# Patient Record
Sex: Female | Born: 1960
Health system: Southern US, Community
[De-identification: ages and names within clinical notes are randomized; demographics above are authoritative.]

## PROBLEM LIST (undated history)

## (undated) DIAGNOSIS — E079 Disorder of thyroid, unspecified: Secondary | ICD-10-CM

## (undated) DIAGNOSIS — E119 Type 2 diabetes mellitus without complications: Secondary | ICD-10-CM

## (undated) DIAGNOSIS — T7840XA Allergy, unspecified, initial encounter: Secondary | ICD-10-CM

## (undated) DIAGNOSIS — I10 Essential (primary) hypertension: Secondary | ICD-10-CM

## (undated) DIAGNOSIS — Z9989 Dependence on other enabling machines and devices: Secondary | ICD-10-CM

## (undated) DIAGNOSIS — G4730 Sleep apnea, unspecified: Secondary | ICD-10-CM

## (undated) DIAGNOSIS — M1990 Unspecified osteoarthritis, unspecified site: Secondary | ICD-10-CM

## (undated) DIAGNOSIS — E7800 Pure hypercholesterolemia, unspecified: Secondary | ICD-10-CM

## (undated) DIAGNOSIS — J45909 Unspecified asthma, uncomplicated: Secondary | ICD-10-CM

## (undated) DIAGNOSIS — K219 Gastro-esophageal reflux disease without esophagitis: Secondary | ICD-10-CM

## (undated) DIAGNOSIS — R87619 Unspecified abnormal cytological findings in specimens from cervix uteri: Secondary | ICD-10-CM

## (undated) DIAGNOSIS — G4733 Obstructive sleep apnea (adult) (pediatric): Secondary | ICD-10-CM

## (undated) HISTORY — DX: Allergy, unspecified, initial encounter: T78.40XA

## (undated) HISTORY — DX: Dependence on other enabling machines and devices: Z99.89

## (undated) HISTORY — DX: Unspecified asthma, uncomplicated: J45.909

## (undated) HISTORY — DX: Unspecified osteoarthritis, unspecified site: M19.90

## (undated) HISTORY — PX: ABDOMINAL HYSTERECTOMY: SHX81

## (undated) HISTORY — DX: Unspecified abnormal cytological findings in specimens from cervix uteri: R87.619

## (undated) HISTORY — DX: Pure hypercholesterolemia, unspecified: E78.00

## (undated) HISTORY — PX: GYNECOLOGIC CRYOSURGERY: SHX857

## (undated) HISTORY — DX: Essential (primary) hypertension: I10

## (undated) HISTORY — DX: Disorder of thyroid, unspecified: E07.9

## (undated) HISTORY — DX: Obstructive sleep apnea (adult) (pediatric): G47.33

## (undated) HISTORY — DX: Sleep apnea, unspecified: G47.30

## (undated) HISTORY — DX: Gastro-esophageal reflux disease without esophagitis: K21.9

## (undated) HISTORY — PX: SPINE SURGERY: SHX786

## (undated) HISTORY — PX: TOTAL VAGINAL HYSTERECTOMY: SHX2548

## (undated) HISTORY — DX: Type 2 diabetes mellitus without complications: E11.9

---

## 1992-01-26 HISTORY — PX: LAPAROSCOPIC CHOLECYSTECTOMY: SUR755

## 1998-01-25 HISTORY — PX: CARPAL TUNNEL RELEASE: SHX101

## 1999-09-04 ENCOUNTER — Other Ambulatory Visit: Admission: RE | Admit: 1999-09-04 | Discharge: 1999-09-04 | Payer: Self-pay | Admitting: Gynecology

## 2000-01-20 ENCOUNTER — Ambulatory Visit (HOSPITAL_BASED_OUTPATIENT_CLINIC_OR_DEPARTMENT_OTHER): Admission: RE | Admit: 2000-01-20 | Discharge: 2000-01-20 | Payer: Self-pay | Admitting: Orthopedic Surgery

## 2000-11-23 ENCOUNTER — Other Ambulatory Visit: Admission: RE | Admit: 2000-11-23 | Discharge: 2000-11-23 | Payer: Self-pay | Admitting: Gynecology

## 2000-12-02 ENCOUNTER — Ambulatory Visit (HOSPITAL_COMMUNITY): Admission: RE | Admit: 2000-12-02 | Discharge: 2000-12-02 | Payer: Self-pay | Admitting: Gynecology

## 2000-12-02 ENCOUNTER — Encounter: Payer: Self-pay | Admitting: Gynecology

## 2000-12-19 ENCOUNTER — Encounter: Admission: RE | Admit: 2000-12-19 | Discharge: 2000-12-19 | Payer: Self-pay | Admitting: Gynecology

## 2000-12-19 ENCOUNTER — Encounter: Payer: Self-pay | Admitting: Gynecology

## 2001-11-20 ENCOUNTER — Ambulatory Visit (HOSPITAL_BASED_OUTPATIENT_CLINIC_OR_DEPARTMENT_OTHER): Admission: RE | Admit: 2001-11-20 | Discharge: 2001-11-20 | Payer: Self-pay | Admitting: *Deleted

## 2002-01-01 ENCOUNTER — Other Ambulatory Visit: Admission: RE | Admit: 2002-01-01 | Discharge: 2002-01-01 | Payer: Self-pay | Admitting: Gynecology

## 2002-01-25 HISTORY — PX: NASAL SINUS SURGERY: SHX719

## 2002-02-09 ENCOUNTER — Ambulatory Visit (HOSPITAL_COMMUNITY): Admission: RE | Admit: 2002-02-09 | Discharge: 2002-02-10 | Payer: Self-pay | Admitting: Otolaryngology

## 2002-02-09 ENCOUNTER — Encounter (INDEPENDENT_AMBULATORY_CARE_PROVIDER_SITE_OTHER): Payer: Self-pay | Admitting: Specialist

## 2002-06-19 ENCOUNTER — Ambulatory Visit (HOSPITAL_BASED_OUTPATIENT_CLINIC_OR_DEPARTMENT_OTHER): Admission: RE | Admit: 2002-06-19 | Discharge: 2002-06-19 | Payer: Self-pay | Admitting: Otolaryngology

## 2003-01-23 ENCOUNTER — Other Ambulatory Visit: Admission: RE | Admit: 2003-01-23 | Discharge: 2003-01-23 | Payer: Self-pay | Admitting: Gynecology

## 2003-04-26 ENCOUNTER — Encounter: Admission: RE | Admit: 2003-04-26 | Discharge: 2003-04-26 | Payer: Self-pay | Admitting: Gynecology

## 2004-01-26 HISTORY — PX: MENISCUS REPAIR: SHX5179

## 2004-01-27 ENCOUNTER — Other Ambulatory Visit: Admission: RE | Admit: 2004-01-27 | Discharge: 2004-01-27 | Payer: Self-pay | Admitting: Gynecology

## 2004-05-12 ENCOUNTER — Encounter: Admission: RE | Admit: 2004-05-12 | Discharge: 2004-05-12 | Payer: Self-pay | Admitting: Gynecology

## 2005-05-13 ENCOUNTER — Other Ambulatory Visit: Admission: RE | Admit: 2005-05-13 | Discharge: 2005-05-13 | Payer: Self-pay | Admitting: Obstetrics and Gynecology

## 2005-05-13 ENCOUNTER — Encounter: Admission: RE | Admit: 2005-05-13 | Discharge: 2005-05-13 | Payer: Self-pay | Admitting: Geriatric Medicine

## 2006-06-01 ENCOUNTER — Encounter: Admission: RE | Admit: 2006-06-01 | Discharge: 2006-06-01 | Payer: Self-pay | Admitting: Geriatric Medicine

## 2006-08-03 ENCOUNTER — Other Ambulatory Visit: Admission: RE | Admit: 2006-08-03 | Discharge: 2006-08-03 | Payer: Self-pay | Admitting: Obstetrics & Gynecology

## 2006-12-12 ENCOUNTER — Encounter: Payer: Self-pay | Admitting: Obstetrics & Gynecology

## 2006-12-12 ENCOUNTER — Inpatient Hospital Stay (HOSPITAL_COMMUNITY): Admission: RE | Admit: 2006-12-12 | Discharge: 2006-12-14 | Payer: Self-pay | Admitting: Obstetrics & Gynecology

## 2007-07-31 ENCOUNTER — Encounter: Admission: RE | Admit: 2007-07-31 | Discharge: 2007-07-31 | Payer: Self-pay | Admitting: Geriatric Medicine

## 2007-08-21 ENCOUNTER — Encounter: Admission: RE | Admit: 2007-08-21 | Discharge: 2007-08-21 | Payer: Self-pay | Admitting: Obstetrics & Gynecology

## 2007-10-26 HISTORY — PX: CERVICAL FUSION: SHX112

## 2007-11-14 ENCOUNTER — Ambulatory Visit (HOSPITAL_COMMUNITY): Admission: RE | Admit: 2007-11-14 | Discharge: 2007-11-15 | Payer: Self-pay | Admitting: Neurological Surgery

## 2008-07-22 ENCOUNTER — Ambulatory Visit (HOSPITAL_BASED_OUTPATIENT_CLINIC_OR_DEPARTMENT_OTHER): Admission: RE | Admit: 2008-07-22 | Discharge: 2008-07-22 | Payer: Self-pay | Admitting: Otolaryngology

## 2008-07-29 ENCOUNTER — Ambulatory Visit: Payer: Self-pay | Admitting: Pulmonary Disease

## 2008-09-04 ENCOUNTER — Encounter: Admission: RE | Admit: 2008-09-04 | Discharge: 2008-09-04 | Payer: Self-pay | Admitting: Obstetrics & Gynecology

## 2008-09-25 DIAGNOSIS — Z9989 Dependence on other enabling machines and devices: Secondary | ICD-10-CM

## 2008-09-25 DIAGNOSIS — G4733 Obstructive sleep apnea (adult) (pediatric): Secondary | ICD-10-CM

## 2008-09-25 HISTORY — DX: Obstructive sleep apnea (adult) (pediatric): G47.33

## 2008-09-25 HISTORY — DX: Obstructive sleep apnea (adult) (pediatric): Z99.89

## 2009-09-22 ENCOUNTER — Encounter: Admission: RE | Admit: 2009-09-22 | Discharge: 2009-09-22 | Payer: Self-pay | Admitting: Obstetrics & Gynecology

## 2010-06-09 NOTE — Discharge Summary (Signed)
[name], [name]              ACCOUNT NO.:  [id number]   MEDICAL RECORD NO.:  [id number]          PATIENT TYPE:  INP   LOCATION:  9313                          FACILITY:  [facility]   PHYSICIAN:  M. Leda Quail, MD  DATE OF BIRTH:  14-Aug-1960   DATE OF ADMISSION:  12/12/2006  DATE OF DISCHARGE:  12/14/2006                               DISCHARGE SUMMARY   ADMISSION DIAGNOSES:  97. A 50 year old, G0, married white female with large fibroid uterus.  2. Hypertension.  3. Hyperlipidemia.  4. Allergies.  5. Obesity.   PROCEDURE:  Total abdominal hysterectomy through a midline incision.   HISTORY OF PRESENT ILLNESS:  A H&P is written in the chart, but in  brief, [name] is a 50 year old, G0, married, white female who has  a large fibroid uterus.  She saw me initially this year where on  abdominal and pelvic exam I immediately felt this large uterus.  She had  never been told that she had an enlarged uterus in the past.  This  uterus had multiple fibroids and was approximately 17-18 cm in size.  Once she was aware of it, she felt like multiple symptoms were due to  this including low back pain and pelvic pain.  Though she did not have  heavy bleeding, she wished for a surgical excision.  She and I discussed  the risks and benefits of this, particularly in light of her having not  typical symptoms with her enlarged large uterus.  She was adamant about  having this removed, and due to its size, I did feel that it was  appropriate also.  We do not know how long it has been this size because  of previous gynecologist never noted its presence, and she has not had  any pelvic imaging in several years.   HOSPITAL COURSE:  The patient was admitted through same-day surgery.  She was taken to the operating room where a total abdominal hysterectomy  was performed.  Uterus and cervix were sent to pathology.  Blood loss of  250 mL occurred during the procedure.  She does have morbid obesity,  and  the surgery was technically difficult.  However, the procedure did go  well.  A JP drain was placed subcutaneously after the surgery to help  with seroma formation.  The patient did well during her surgery and was  taken to the recovery room.  After an appropriate time there, she was  transferred to the third floor for the remainder of her hospitalization.  She did have a Foley catheter placed to straight drain and did have a  Dilaudid PCA for pain control.  She also had an OnQ pump that was  subfascially.  The patient has an aspirin allergy, and I did not feel  that it was appropriate to use Toradol on her.  In the evening of  postoperative day 0, she was having some heartburn and some mild nausea  but otherwise was doing well.  Vital signs were stable.  She was  afebrile.  She was making good urine.  Her abdomen was quiet, but her  exam was otherwise benign.  Pepcid was added IV.  In the morning of  postoperative day #1, the patient had no complaints.  She looked very  good and had excellent pain control.  Vital signs were stable.  She was  afebrile.  She had had 4150 mL of urine output overnight.  Postoperative  labs showed a white count 5.7, hemoglobin of 11.1, platelet count of  189.  Electrolytes were normal with a BUN and creatinine of 4 and 0.8,  and glucose was 130.  She did had some occasional bowel sounds.  Her  abdomen was soft, and she had a clean, dry, and intact incision.  She  had about 20 mL of serosanguineous drainage from her JP drain.  Again,  she was on the evening of postoperative day #1 without any issues.  In  the morning of postoperative day #2, she was without complaints and had  excellent pain control.  Vital signs again were stable.  She was voiding  without difficulty.  Her JP had put out 30 mL all day the previous day  and 5 mL since midnight.  At this point, she was tolerating a regular  diet, ambulating without difficulty, voiding without difficulty,  and  ambulating without difficulty.  She had a completely benign exam.  At  this point, I did feel discharge was appropriate.   DISCHARGE MEDICATIONS:  She is to resume all of her home medications  which include:  1. Proventil inhaler p.r.n.  2. Zetia 10 mg daily.  3. Singulair 10 mg daily.  4. Xyzal 5 mg daily.  5. Lotrel 5/20 mg daily.  6. Crestor 40 mg daily.  7. Hydrochlorothiazide 12.5 mg daily.  8. She is also going to be using Percocet 5/325 one to two tablets      p.o. q.4-6h. p.r.n. pain.  9. She is taking Cipro 500 mg b.i.d. for 7 days and Keflex 500 mg      q.i.d. for 7 days.   FOLLOW UP:  She has been given discharge instructions in the written and  verbal form, and she will follow up with me in 1 week.  The patient also  has numbers to call if she has any questions or problems.      Lum Keas, MD  Electronically Signed     MSM/MEDQ  D:  12/14/2006  T:  12/14/2006  Job:  [id number]

## 2010-06-09 NOTE — Op Note (Signed)
[name], [name]              ACCOUNT NO.:  [id number]   MEDICAL RECORD NO.:  [id number]          PATIENT TYPE:  OIB   LOCATION:  3524                         FACILITY:  [facility]   PHYSICIAN:  Stefani Dama, M.D.  DATE OF BIRTH:  October 01, 1960   DATE OF PROCEDURE:  11/14/2007  DATE OF DISCHARGE:                               OPERATIVE REPORT   PREOPERATIVE DIAGNOSES:  Cervical spondylosis and herniated nucleus  pulposus, C4-5 and C5-6 with a cervical radiculopathy.   POSTOPERATIVE DIAGNOSES:  Cervical spondylosis and herniated nucleus  pulposus, C4-5 and C5-6 with a cervical radiculopathy.   PROCEDURES:  Anterior cervical decompression arthrodesis at C4-5 and C5-  6, structural allograft, and Alphatec plate fixation, C4-C6.   SURGEON:  Stefani Dama, MD   FIRST ASSISTANT:  Coletta Memos, MD   ANESTHESIA:  General endotracheal.   INDICATIONS:  [name] is a 50 year old female, who has had significant  neck, shoulder, and arm pain.  She has evidence of spondylosis at C4-C5  and C5-C6 with a herniated nucleus pulposus on the right side of C4-C5  and spondylotic narrowing at C5-6.  She has been advised regarding  surgical decompression and is now taken to the operating room.   PROCEDURE:  The patient was brought to the operating room supine on the  stretcher.  After smooth induction of general endotracheal anesthesia,  she was placed in 5 pounds of Halter traction.  Neck was prepped with  alcohol and DuraPrep, and draped in a sterile fashion.  A transverse  incision was carried down through the platysma.  The plane between the  sternocleidomastoid and strap muscles was dissected bluntly until the  prevertebral space was reached.  First identifiable disk space was noted  to be that of C4-C5 and localizing  radiographs and then longus colli  muscle was stripped from either side in the midline to allow placement  of Caspar retractor.  With the disk space being exposed, a 15 blade  was  used to open the anterior  longitudinal ligament and a combination of  curettes and rongeurs were used to remove the disk.  In the region of  the posterior longitudinal ligament on the right side, there was noted  to be a significant opening with several fragments of disk material that  had herniated self through and lateral recess.  This was removed.  Left  side was similarly decompressed.  There was no such opening on the left  side.  Endplates were then smoothed with a bur, that was 4.5 mm in  diameter and was barrel shaped.  Once the endplates were completely  smoothed, the bone graft that was in the Alphatec graft, measuring 7-mm  in height was contoured the appropriate dimensions and fit in the  interspace and then filled with demineralized bone matrix placed into  the recess and countersunk appropriately.  Attention was then turned to  C5-6, where a similar procedure was carried out.  Here that was noted to  be bilateral foraminal stenosis and significant  spondylotic overgrowth.  This was decompressed in the similar fashion.  Interspace and 8-mm bone  graft was finished in the same fashion as it was in C4-5 contoured  appropriately, filled with demineralized bone matrix and placed into the  interspace.  Traction was then removed.  Ventral aspect was inspected  and a 34-mm plate was fixed into the ventral aspect of the vertebral  bodies with 4 variable angled locking 14-mm screws.  Final radiograph  identified good position of the hardware and  good overall maintenance of the alignment.  At this point, the platysma  was then closed with 3-0 Vicryl in an interrupted fashion, 3-0 Vicryl  was used in the subcuticular tissues, and dry sterile dressing was  applied to the skin.  The patient tolerated procedure well, was returned  to recovery room in stable condition.      Stefani Dama, M.D.  Electronically Signed     HJE/MEDQ  D:  11/14/2007  T:  11/15/2007  Job:   [id number]

## 2010-06-09 NOTE — Op Note (Signed)
NAMETIERA, MENSINGER              ACCOUNT NO.:  [id number]   MEDICAL RECORD NO.:  [id number]          PATIENT TYPE:  AMB   LOCATION:  SDC                           FACILITY:  [facility]   PHYSICIAN:  M. Leda Quail, MD  DATE OF BIRTH:  Mar 15, 1960   DATE OF PROCEDURE:  12/12/2006  DATE OF DISCHARGE:                               OPERATIVE REPORT   PREOPERATIVE DIAGNOSES:  17. A 50 year old G0 married white female with fibroid uterus.  2. Hypertension.  3. Elevated lipids.  4. Seasonal allergies.  5. Morbid obesity.   POSTOPERATIVE DIAGNOSES:  64. A 50 year old G0 married white female with fibroid uterus.  2. Hypertension.  3. Elevated lipids.  4. Seasonal allergies.  5. Morbid obesity.   PROCEDURE:  Total abdominal hysterectomy.   SURGEON:  M. Leda Quail, M.D.   ASSISTANT:  Edwena Felty. Romine, M.D.   ANESTHESIA:  General endotracheal.   FINDINGS:  Enlarged globular uterus 16 to 18 weeks in size with multiple  fibroids.  At least a 7- to 8-cm fundal fibroid was present and an  approximately 5-cm fibroid that was just above the level of the internal  os.   SPECIMENS:  Uterus and cervix sent to pathology.   ESTIMATED BLOOD LOSS:  250 mL.   URINE OUTPUT:  100 mL of clear urine in the Foley catheter.   FLUIDS:  2500 mL of LR.   COMPLICATIONS:  None.   INDICATIONS:  Ms. Tuel is a 50 year old G0 married white female with  fibroid uterus that was noted on physical exam.  She was a new patient  to me this year.  I recommended doing a pelvic ultrasound.  She had a 17-  cm plus uterus that was noted.  There were multiple fibroids present  with the largest one at least 7 cm in size.  She had seen a gynecologist  regularly and never been told of this problem.  Although she does not  have menorrhagia, she does have some issues with pelvic and back pain.  As soon as she learned that she had a uterus this big she wanted it out.  It was counseled this was most likely benign  and that she was not having  some of the common symptoms that women have with enlarged uteruses,  particularly bleeding.  However, she requested surgical removal, and  given its size and difficulty in knowing how long it had been present or  how fast it was growing, I did agree with the patient.  She does have  morbid obesity, and she has been counseled about the risks of surgery,  specifically infection risk and skin incision infection risk.  All of  this was documented in her office chart.   PROCEDURE:  Patient was taken to the operating room.  She was placed in  a supine position.  Running IV in place.  General endotracheal  anesthesia is administered by the anesthesia staff without difficulty.  Legs were positioned in the frog-leg position.  Abdomen, perineum, inner  thighs and vagina were prepped in normal sterile fashion.  Foley  catheter was inserted under  sterile conditions.  Legs were straightened.  There are in a little bit of a bend with a pillow underneath the knees.  The patient was then draped in normal sterile fashion.  Midline skin  incision was made with a knife and carried down through subcutaneous fat  and tissue.  Cautery was used as necessary.  The fascia was identified  and incised in the midline.  The midline appeared to be slightly right  of the incision.  Kocher's clamp were used to grasp the incision and  elevate it.  Division of the rectus muscles was noted at this point.  The fascial incision was extended superiorly and inferiorly.  Then, the  preperitoneal fat tissue was dissected bluntly.  Peritoneum was  identified, popped through with a hemostat.  An operator's fingers were  placed in this incision.  The operator's fingers were used to feel  around; no adhesions were noted.  Using cautery, the peritoneal incision  was extended superiorly and inferiorly with care taken to note location  of the bladder and the bowel.  Once the incision was closer to the   bladder inferiorly, Metzenbaums were used to dissect the tissue  carefully, and the incision was brought down on the right side of the  bladder, keeping adequate distance from the bladder.  At this point, the  pelvis was surveyed.  Over on the right hand side had a small cyst.  The  left side showed a small ovary.  The uterus did have fibroids as noted  on ultrasound.  There was a large fundal with several smaller fundal  fibroids.  Tissue was smooth.  However, there was posterior fibroid as  well, and the uterus did not pull up very easily.  Decision was made to  go ahead and place the retractor.  A Balfour retractor was placed with  the deep blades on both sides.  Due to her obesity, there was not the  great visualization at this point.  We were as far inferiorly as we  could go, and decision was made to make the incision superiorly about 2  cm higher.  This was still below the level of the umbilicus.  The  peritoneal incision, and fascial incision was extended superiorly as  well.  This still created some difficulty, so the Terressa Koyanagi was removed,  and an Teacher, early years/pre was attempted.  Once this was tightened down,  there was no improvement in visualization, and therefore, the Balfour  retractor was obtained.  This was placed again.  Care was taken to  ensure no bowel was present as the retractor was tightened.  Then, 4  laparotomy sponges were used to pack bowel superiorly.  Bladder blade  was placed inferiorly.  Unfortunately, the patient did have quite a bit  of pelvic fat as well and an additional curved Deaver or a sweetheart  retractor was required throughout the entire procedure as well as  manipulating and retracting on the uterus for the operator and assistant  to see adequately.  This initial portion of the procedure to look at  least 30-45 minutes to get adequate visualization, and as stated above,  it was still necessary to have an additional assistant holding a  retractor  throughout most of the procedure.   Attention was initially turned to the left round ligament.  This was  suture ligated and incised with cautery.  The anterior leaf of broad  ligament was opened.  This was difficult due to the anterior fibroid  that  was present.  Care was taken to ensure that the dissection was kept  high and close to the uterus.  The posterior leaf of the broad ligament  was then opened.  The utero-ovarian pedicle on the left side was  isolated, doubly clamped with Heaney clamps, transected and suture  ligated with a free tie of #0 running Vicryl in a stitch tie.  In a  similar fashion, the right uterosacral ligament was then suture ligated.  Retractors needed to be manipulated as well as the uterus to see  adequately.  This #0 Vicryl stitch was placed on the round ligament.  Then, the round ligament was incised.  Inferior leaf of the broad  ligament was then opened with Metzenbaum scissors.  This was carried to  the midline and met where the previous dissection had occurred.  At this  point, the peritoneum was still up on the fibroid that was coming off  the superior aspect of the cervix.  The uterus was lifted out of the  pelvis and pushed posteriorly as well to visualize the space better.  Then, the peritoneum was dissected off of the fibroid.  At the level of  the internal os, the pubovesical cervical fascia dissection could then  be performed.  At this point, attention was turned back to the right  side.  The right utero-ovarian ligament was isolated and then clamped  with a curved Heaney clamp x2.  This was transected and then tied with a  free tie of #0 Vicryl and a stitch tie.  At this point, the uterine  arteries were skeletonized bilaterally, clamped twice with Heaney  clamps, transected and suture ligated with free tie of #0 Vicryl and  then a stitch tie.  At this point, the uterus could be amputated.  Unfortunately, the uterus could not be lifted well enough  to amputate  the whole uterus, so the large fundal fibroid first was taken off.  Once  this was done, 2 clamps on either side of the cervix of the beginning of  the cardinal ligament were placed.  These were transected and suture  ligated with #0 Vicryl.  At this point, the uterus could then be  amputated.  Once this was done, 2 Kocher clamps were placed across the  cervix.  The pelvis was easily visualized at this point.  The  pubovesical cervical fascia was then further dissected.  The bladder was  pushed down the cervix.  Cardinal ligaments were serially clamped,  transected and suture ligated with further dissection of the bladder  flap as necessary.  The uterosacral ligament on each side were then  clamped, transected and suture ligated with a #0 Vicryl.  These stitches  were kept long.  At this point, curved Heaney clamps could be placed  around the base of the cervix.  This was done without difficulty.  The  pedicles were transected and suture ligated with Heaney sutures of #0  Vicryl.  Then, using Jorgenson scissors and keeping close to the cervix,  vaginal mucosa was excised from the edge of the cervix.  Vaginal mucosa  was grasped with curved Kocher clamps on each side.  Then, the corners  were stitched using #0 Vicryl and attaching the edge of the vaginal cuff  to the ipsilateral uterosacral ligament.  Then, 3 additional figure-of-  eight sutures of #0 Vicryl used to close the remainder of the cuff.  The  bladder was well below the level of the cuff closure.   At  this point, pelvis was irrigated with copious amounts of warm normal  saline.  There was a small amount of bleeding at the cuff, and an  additional figure-of-eight suture of #0 Vicryl was required to make  hemostasis present.  The bladder blade was admitted at this point, and  there was some bleeding off of the back of the bladder.  DeBakey forceps  were used to grasp each area of bleeding which was then cauterized.   This was done very superficial and lightly only to ensure hemostasis.  Gelfoam was placed on the back of the bladder.  The ovaries were  visualized bilaterally, and no bleeding was noted.  At this point, the  retractor was removed from the abdomen.  The moist laparotomy sponges  were removed.  Sponge counts were correct at this point.  The bowel was  placed in normal anatomic position, and the omentum was placed over top  of the bowel.   The peritoneum was closed with a running stitch of #0 Vicryl.  The  fascia was closed.  Then, an On-Q pump tubing was placed subfascially,  on the patient's left side.  This tubing was attached to the skin using  Steri-Strips, and an OpSite was placed on top of it.  Then, the fascia  was closed with a #0 PDS.  This was a single-stranded PDS.  This was  started at the inferior portion of the incision.  Through-and-through  stitch was used to close the fascia.  This was brought to the midline,  and a second stitch was started superiorly and again brought to the  midline.  As this was being closed, the operator reached up to grab the  light handle to move the light.  The light handle fell down onto the  field with the nonsterile side on the field.  This was not in the  incision, just 4 or 5 cm above the incision.  A new green towel was  placed over this to keep the site sterile.  All gloves were changed.  Instruments that were in the same area of the field were removed and  handed off as dirty.  Operators changed their gloves, and a new light  handle was obtained.  However, because of this and the patient's morbid  obesity, I continue her on antibiotics for longer than standard time  frame.  The PDS was then tied in the midline, bringing the fascia  together adequately.   The subcutaneous fat and tissue were irrigated.  Copious amounts of warm  normal saline were used.  Hemostasis was present.  A #7 flat JP drain  was placed in the subcutaneous space and  brought out the patient's right  side by making an incision on the skin.  The drain was held in place by  suturing it with a #2-0 silk.  Zero plain gut suture was then used to  reapproximate subcutaneous fat and tissue.  Finally, the skin was closed  with a 3-0 subcuticular stitch of Vicryl.  The skin was cleansed.  Benzoin and Steri-Strips were applied.  Five mL of 1% lidocaine was  instilled subfascially.  Grenade was placed on the subcutaneous drain.   Sponge, lap, needle and instrument counts were correct x2.  The patient  tolerated the procedure very well.  She was awakened from anesthesia and  taken to recovery room in stable addition.      Lum Keas, MD  Electronically Signed     MSM/MEDQ  D:  12/12/2006  T:  12/13/2006  Job:  [id number]

## 2010-06-09 NOTE — Procedures (Signed)
[name], [name]              ACCOUNT NO.:  [id number]   MEDICAL RECORD NO.:  [id number]          PATIENT TYPE:  OUT   LOCATION:  SLEEP CENTER                 FACILITY:  [facility]   PHYSICIAN:  Oretha Milch, MD      DATE OF BIRTH:  09-16-1960   DATE OF STUDY:  07/22/2008                            NOCTURNAL POLYSOMNOGRAM   REFERRING PHYSICIAN:   INDICATIONS FOR THE STUDY:  Ms. [name] is a 50 year old woman, who had  uvulo-palato-pharyngoplasty and tonsillectomy in January 2004 with a  followup sleep study demonstrating an RDI of 7.8 events per hour.  She  now has audible breathing and gasping for air during sleep with  excessive daytime fatigue as her husband's description.  At the time of  the study, her weight was 240 pounds, height 5 feet 6 inches, BMI of 39.  Neck size 16 inches.   EPWORTH SLEEPINESS SCORE:  Epworth sleepiness score was 9.   This split polysomnogram was performed with a sleep technologist in  attendance.  EEG, EOD, EMG, EKG and respiratory parameters were  recorded.  Sleep stages, arousals, limb movements, and respiratory data  were scored according to criteria laid out by the American Academy of  the Sleep Medicine.   SLEEP ARCHITECTURE:  Lights out was at 9:14 p.m.  Lights on was at 5:02  a.m.  During the diagnostic portion, total sleep time was 119 minutes  with a sleep period time of 152 minutes and has sleep maintenance  efficiency of 86%.  Sleep latency was 23 minutes.  No REM sleep was  noted.  Sleep stages of the percentage of total sleep time was N1 90%,  N2 78%, and N3 12%.  She slept supine for 49 minutes.  During the  titration, CPAP was initiated at 9 minutes past midnight.  During the  titration portion, there was one period of REM sleep lasting 11.5  minutes.  Frequent arousals were noted.   RESPIRATORY DATA:  During the baseline portion, there were 0 obstructive  apnea, 0 central apnea, 0 mixed apnea apneas, and 48 hypopneas with an  apnea-hypopnea index of 24.2 events per hour.  The supine AHI was 41  events per hour.  Due to this degree of respiratory disturbance, CPAP  was initiated at 4 cm and titrated by the tech to maximum of 20 cm and a  level of 10 cm for 31 minutes of sleep including 4 minutes of REM sleep.  No events were noted with a lowest desaturation of 94% and level of 11  cm for 9.5 minutes of sleep including 7.5 minutes of REM sleep; 1  central apnea and 1 mixed apnea were noted with the lowest desaturation  of 86%.  The level of 13 cm for 40 minutes; 5 central apneas and 1  obstructive apnea was noted and the lowest desaturation of 90%.  Central  apneas seem to emerge at higher levels and persisted to a final level of  7 central apneas during 39 minutes of sleep at 20 cm.  There were  increased arousals at the higher levels of CPAP.   AROUSAL DATA:  During the diagnostic portion, most  of the arousals were  associated with respiratory events.  During the titration portion, most  arousals were spontaneous and seem to increase at the higher levels of  CPAP.   LIMB MOVEMENT:  No significant limb movements were noted.   OXYGEN SATURATION DATA:  Lowest desaturation during the diagnostic  portion was 77%.  During the titration portion, the lowest desaturation  at 10 cm was 94%.  At higher levels of CPAP, desaturation seem to occur  due to central apneas.   CARDIAC DATA:  No significant arrhythmias were noted.  The low heart  rate during the diagnostic portion was 35 beats per minute.   DISCUSSION:  A small full-face ResMed Quattro mask was used for  desensitization.  Predominant hypopneas noted during supine sleep during  the diagnostic portion, although optimal level seemed to be 10-13 cm and  the tech continue to titrate higher and central apneas emerged at higher  levels of CPAP.  A lot of sleep break instability was noted with central  apneas noted during wake epochs.   IMPRESSION:  1. Moderate  degree of sleep-disordered breathing with predominant      hypopneas during supine sleep.  2. This was corrected by a continuous positive airway pressure of 10      cm with a small full-face mask.  3. No evidence of cardiac arrhythmias, limb movements, or behavioral      disturbance during sleep.   RECOMMENDATIONS:  1. The treatment options for this degree of sleep-disordered breathing      include weight loss and CPAP therapy. I note that the patient has      already undergone a uvulo-palato-pharyngoplasty.  Although      positional therapy may be helpful (avoid sleeping in the supine      position),  I doubt that this would completely treat this degree of      sleep apnea.  2. Optimal level of CPAP would be 10-12 cm with a small full-face      mask.  A CPAP download can be obtained in 4 weeks and symptoms      followed for a complete control of events.  3. She should be cautioned against driving against sleepy.  She should      be asked to avoid medications with sedative side effects.      Oretha Milch, MD  Electronically Signed     RVA/MEDQ  D:  07/29/2008 18:15:11  T:  07/30/2008 01:04:03  Job:  [id number]   cc:   Onalee Hua L. Annalee Genta, M.D.  Fax: [phone]

## 2010-06-12 NOTE — Discharge Summary (Signed)
[name], [name]                        ACCOUNT NO.:  [id number]   MEDICAL RECORD NO.:  [id number]                   PATIENT TYPE:  OIB   LOCATION:  2310                                 FACILITY:  [facility]   PHYSICIAN:  Kinnie Scales. Annalee Genta, M.D.            DATE OF BIRTH:  10/28/1960   DATE OF ADMISSION:  02/09/2002  DATE OF DISCHARGE:  02/10/2002                                 DISCHARGE SUMMARY   ADMISSION DIAGNOSES:  1. Mild obstructive sleep apnea.  2. Nasal septal deviation with obstruction.  3. Uvular, palatal, and tonsillar hypertrophy.   DISCHARGE DIAGNOSES:  1. Mild obstructive sleep apnea.  2. Nasal septal deviation with obstruction.  3. Uvular, palatal, and tonsillar hypertrophy.   SURGICAL PROCEDURE THIS ADMISSION:  1. Uvulopalatopharyngoplasty.  2. Nasal septoplasty.  3. Bilateral inferior turbinate _________ cautery.  4. Tonsillectomy.   DATE OF SURGERY:  February 09, 2002.   DISPOSITION:  The patient is discharged to home in stable condition in the  company of her husband.   DISCHARGE MEDICATIONS:  Include her preoperative medication bromocriptine  2.5 mg p.o. b.i.d.   ADDITIONAL DISCHARGE MEDICATIONS:  1. Augmentin elixir 400 mg/5 cc two teaspoons b.i.d. for 10 days.  2. Lortab elixir three to four teaspoons every 4-6 hours as need for pain.  3. Vioxx 50 mg p.o. daily.   ACTIVITY:  Is limited.  No lifting, straining, or nose blowing.   DIET:  Clear liquid and soft diet as tolerated.   WOUND CARE:  Saline nasal spray four sprays per nostril every hour while  awake.   FOLLOW UP:  The patient is scheduled to follow up with me on February 19, 2002 for postoperative care or sooner if warranted.   BRIEF HISTORY:  The patient is a 50 year old white female who was referred  for evaluation of chronic nasal airway obstruction, nighttime snoring, and  obstructive sleep apnea.  An inpatient sleep study was performed at the  [facility] and the  patient was found to have mild obstructive  sleep apnea.  She was titrated on CPAP but was unable to tolerate CPAP  secondary to pressure and discomfort.  With chronic symptoms of nasal airway  obstruction and congestion, I recommended we consider her for surgical  intervention for the management of obstructive sleep apnea.  The risks,  benefits and possible complications of the above surgical procedure was  discussed in detail with the patient and her husband and they understood and  concurred with our plan for surgery which was scheduled for February 09, 2002  at [facility] Main OR.   HOSPITAL COURSE:  The patient was admitted to the ENT service under Dr.  Thurmon Fair care on February 09, 2002 and taken from the preadmission area to  the main OR at [facility].  Under general anesthesia the patient  underwent uvulopalatopharyngoplasty, nasal septoplasty, tonsillectomy,  inferior turbinate reduction performed  on February 09, 2002.  The patient  tolerated the surgical procedure without complication and there were no  postoperative problems.  The patient was transferred from the operating room  to recovery room for initial postoperative care, and then from recovery to  unit 2300 for postoperative monitoring.  The patient had a history of  obstructive sleep apnea and given that history continuous pulse oximetry and  cardiac monitoring was undertaken in the ICU.  The patient's postoperative  course was uneventful.  She had good oxygen saturation and no exacerbation  of obstructive sleep apnea.  Her oxygen saturation was 97% or better  throughout the first postoperative night on humidified room air.  The  patient was tolerating a soft oral diet without difficulty.  Pain management  was well controlled with oral Lortab elixir.  She was tolerating soft oral  diet and liquids without difficulty.  She had normal bowel and bladder  function.  She was ambulating without problem.   Examination on the first  postoperative morning showed a minimal bloody nasal discharge.  The nasal  splints were intact.  Oral cavity/oropharynx showed stable status post  uvulopalatopharyngoplasty and tonsillectomy with sutures intact.  No  bleeding, hemorrhage, or edema.  The patient is discharged to home in stable  condition with the above discharge instructions in the company of her  husband.  They are comfortable with her discharge planning and will follow  up with me in approximately 7-10 days for further postoperative care or  sooner if warranted.                                               Kinnie Scales. Annalee Genta, M.D.    DLS/MEDQ  D:  40/98/1191  T:  02/11/2002  Job:  [id number]

## 2010-06-12 NOTE — Op Note (Signed)
NAMEBRIGHID, KOCH                        ACCOUNT NO.:  [id number]   MEDICAL RECORD NO.:  [id number]                   PATIENT TYPE:  OIB   LOCATION:  2890                                 FACILITY:  [facility]   PHYSICIAN:  Kinnie Scales. Annalee Genta, M.D.            DATE OF BIRTH:  Jun 12, 1960   DATE OF PROCEDURE:  02/09/2002  DATE OF DISCHARGE:                                 OPERATIVE REPORT   PREOPERATIVE DIAGNOSES:  1. Mild obstructive sleep apnea.  2. Nasal septal deviation with nasal obstruction.  3. Uvula, palatal, and tonsillar hypertrophy.   POSTOPERATIVE DIAGNOSES:  1. Mild obstructive sleep apnea.  2. Nasal septal deviation with nasal obstruction.  3. Uvula, palatal, and tonsillar hypertrophy.   INDICATIONS FOR PROCEDURE:  Mild obstructive sleep apnea, nasal septal  deviation with nasal obstruction, uvula, palatal, and tonsillar hypertrophy.   PROCEDURE:  1. Uvulopalatopharyngoplasty.  2. Nasal septoplasty.  3. Tonsillectomy.  4. Bilateral inferior turbinate intramural cautery.   ANESTHESIA:  General endotracheal.   SURGEON:  Dr. Annalee Genta, M.D.   COMPLICATIONS:  None.   ESTIMATED BLOOD LOSS:  Approximately 50 cc.  The patient was transferred  from the operating room to the recovery room in stable condition.   HISTORY:  The patient is a 50 year old white female who was referred for  evaluation of snoring, nasal airway obstruction and symptoms of obstructive  sleep apnea.  A new patient sleep study was performed in October 2003, which  showed mild obstructive sleep apnea with minimal oxygen desaturation.  The  patient was titrated on CPAP, but was unable to tolerate the CPAP, secondary  to deviated nasal obstruction and pressure.  Examination revealed a deviated  nasal septum with bilateral partial obstruction, bilateral inferior  turbinate hypertrophy, and uvulopalatal and tonsillar hypertrophy.  In  counseling the patient regarding various treatments, we discussed  ongoing  use of CPAP, but the patient is unable to tolerate this as a long-term  solution for sleep apnea.  I recommended that we consider her for nasal  septoplasty and turbinate reduction, with uvulopalatal pharyngoplasty and a  tonsillectomy.  The risks, benefits and possible complications of each of  these surgical procedures were discussed in detail with the patient and with  her husband, who concurred with our plans for surgery, which was scheduled  as above.  They understood the risks, benefits and possible complications of  these procedures, and concurred with our surgical plan.   DESCRIPTION OF PROCEDURE:  The patient was brought to the operating room on  February 09, 2002, and placed in the supine position on the operating room  table.  General endotracheal anesthesia was established without difficulty.  When the patient was adequately anesthetized, she was injected with 9 cc of  1% lidocaine and 1:100,000 solution epinephrine, injected in a submucosal  fashion along the nasal septum and inferior turbinates.  The patient's nose  was then packed with Afrin-soaked cotton  with pledgets and left in place for  approximately 10 minutes to allow for vasoconstriction.  She was prepped and  draped in a sterile fashion.  The surgical procedure was begun with a nasal  septoplasty.  Nasal cottonoids were removed.  The nasal cavity was examined.  She was found to have a deviated septum with inferior septal spurring.  A  left hemitransfixion incision was created and a mucoperichondrial flap was  elevated from anterior to posterior on the left-hand side.  The bony  cartilaginous junction was crossed at the midline and the uvuloperiosteal  flap was elevated along the patient's right-hand side.  Deviated bone and  cartilage from the mid and the posterior aspects of the nasal septum were  then resected using a 4-mm osteotome and through-cutting forceps.  The  anterior columellar and dorsal  cartilage was not deviated, and this was left  intact and not implemented.  The mid-septal cartilage was morselized and  returned to the mucoperichondrial pocket, and mucoperichondrial flaps were  reapproximated with a #4-0 gut suture on a Keith needle in a horizontal  mattressing fashion.  Bilateral Doyle nasoseptal splints were then placed,  after the application of Bactroban ointment.  They were sutured into  position with a #3-0 Ethilon suture.  The inferior turbinate intramural cautery was then performed with the  cautery set at 12 watts, and 2000 were made in the submucosal fascia and  each inferior turbinate.  When the turbinates were adequately cauterized  they were out-fractured to obtain a more patent nasal cavity.  Attention was then turned to the patient's oral cavity and oral pharynx,  where a tonsillectomy and uvulopalatopharyngoplasty was undertaken.  The  Crowe-Davis mouth gag was inserted without difficulty.  There were no loose  or broken teeth.  The hard and soft palate were intact.  The procedure was  begun on the patient's left-hand side using a harmonic scalpel, and  dissecting in a subcapsular fashion, removing the entire left tonsil from  superior pole to tongue base.  The right tonsil was removed in a similar  fashion.  The tonsillar fossa was then gently abraded with a dry tonsillar  sponge.  The Crowe-Davis mouth gag was released and reapplied.  There were  some areas of point hemorrhage, which were cauterized using suction cautery.  The tonsil was removed.  The uvulopalatopharyngoplasty was then performed.  The uvulopalatopharyngoplasty was performed using a harmonic scalpel,  dissecting along the anterior aspect of the tonsil, along the line of the  anterior tonsillar fillers, dissecting from anterior to posterior, and  leaving the posterior mucosal flap intact.  Approximately 1.0 cm of palatal margin, including the uvula, mucosa, and muscular layer were  resected.  The  posterior tonsillar pillars were left intact.  The uvulopalatopharyngoplasty  site was reconstructed using a #3-0 Vicryl suture on a tapered needle in a  horizontal mattressing fashion, and advanced to the posterior tonsillar  pillars anteriorly, and closing the tonsillar fossa.  The posterior palatal  mucosal flap was then advanced and approximated with the anterior mucosa,  making a smooth margin along the entire palatal margin, and creating a  widely patent oral cavity and oropharynx.  The patient's nasal cavity and  nasopharynx, oral cavity and oropharynx were then irrigated and suctioned.  An orogastric tube was passed and the stomach contents were aspirated.  There was no active bleeding.  The Crowe-Davis mouth gag was released and  reapplied.  Again no active bleeding was noted.  There were  no loose or  broken teeth.  The Crowe-Davis mouth gag was removed.  The patient was then awakened from anesthetic.  She was extubated and was  transferred from the operating room to the recovery room in stable  condition.  There were no complications.  The estimated blood loss was  approximately 50 cc.                                                Kinnie Scales. Annalee Genta, M.D.    DLS/MEDQ  D:  04/54/0981  T:  02/09/2002  Job:  [id number]

## 2010-07-20 LAB — HM COLONOSCOPY

## 2010-10-21 ENCOUNTER — Other Ambulatory Visit: Payer: Self-pay | Admitting: Obstetrics & Gynecology

## 2010-10-21 DIAGNOSIS — Z1231 Encounter for screening mammogram for malignant neoplasm of breast: Secondary | ICD-10-CM

## 2010-10-26 LAB — CBC
MCHC: 32.8
MCV: 73 — ABNORMAL LOW
Platelets: 218
RBC: 5.13 — ABNORMAL HIGH

## 2010-10-26 LAB — BASIC METABOLIC PANEL
BUN: 9
CO2: 28
Calcium: 9.6
Creatinine, Ser: 0.6
GFR calc Af Amer: >60
Glucose, Bld: 87
Potassium: 4.1
Sodium: 138

## 2010-11-03 LAB — COMPREHENSIVE METABOLIC PANEL
ALT: 21
AST: 19
BUN: 9
CO2: 29
Chloride: 101
Creatinine, Ser: 0.82
GFR calc Af Amer: >60
GFR calc non Af Amer: >60
Sodium: 137
Total Bilirubin: 0.4
Total Protein: 7.3

## 2010-11-03 LAB — CBC
HCT: 33.4 — ABNORMAL LOW
HCT: 38.2
Hemoglobin: 11.1 — ABNORMAL LOW
MCHC: 33.3
MCHC: 33.8
MCV: 74.6 — ABNORMAL LOW
RDW: 15.5
RDW: 15.6 — ABNORMAL HIGH
WBC: 5.2

## 2010-11-03 LAB — BASIC METABOLIC PANEL
Calcium: 8.6
Chloride: 103
Creatinine, Ser: 0.82
GFR calc Af Amer: >60
Glucose, Bld: 130 — ABNORMAL HIGH
Sodium: 138

## 2010-11-03 LAB — PREGNANCY, URINE: Preg Test, Ur: NEGATIVE

## 2010-11-03 LAB — TYPE AND SCREEN

## 2010-11-03 LAB — URINALYSIS, ROUTINE W REFLEX MICROSCOPIC: Urobilinogen, UA: 0.2

## 2010-11-25 ENCOUNTER — Ambulatory Visit
Admission: RE | Admit: 2010-11-25 | Discharge: 2010-11-25 | Disposition: A | Discharge status: Home / Self Care | Payer: 59 | Source: Ambulatory Visit | Attending: Obstetrics & Gynecology | Admitting: Obstetrics & Gynecology

## 2010-11-25 DIAGNOSIS — Z1231 Encounter for screening mammogram for malignant neoplasm of breast: Secondary | ICD-10-CM

## 2011-10-18 ENCOUNTER — Other Ambulatory Visit: Payer: Self-pay | Admitting: Obstetrics & Gynecology

## 2011-10-18 DIAGNOSIS — Z1231 Encounter for screening mammogram for malignant neoplasm of breast: Secondary | ICD-10-CM

## 2011-11-26 ENCOUNTER — Ambulatory Visit
Admission: RE | Admit: 2011-11-26 | Discharge: 2011-11-26 | Disposition: A | Discharge status: Home / Self Care | Payer: 59 | Source: Ambulatory Visit | Attending: Obstetrics & Gynecology | Admitting: Obstetrics & Gynecology

## 2011-11-26 DIAGNOSIS — Z1231 Encounter for screening mammogram for malignant neoplasm of breast: Secondary | ICD-10-CM

## 2012-05-18 ENCOUNTER — Encounter: Payer: Self-pay | Admitting: Obstetrics & Gynecology

## 2012-05-18 ENCOUNTER — Ambulatory Visit (INDEPENDENT_AMBULATORY_CARE_PROVIDER_SITE_OTHER): Payer: 59 | Admitting: Obstetrics & Gynecology

## 2012-05-18 VITALS — BP 136/82 | Ht 65.75 in | Wt 230.4 lb

## 2012-05-18 DIAGNOSIS — Z01419 Encounter for gynecological examination (general) (routine) without abnormal findings: Secondary | ICD-10-CM

## 2012-05-18 DIAGNOSIS — E039 Hypothyroidism, unspecified: Secondary | ICD-10-CM

## 2012-05-18 DIAGNOSIS — N393 Stress incontinence (female) (male): Secondary | ICD-10-CM

## 2012-05-18 DIAGNOSIS — Z0000 Encounter for general adult medical examination without abnormal findings: Secondary | ICD-10-CM

## 2012-05-18 LAB — POCT URINALYSIS DIPSTICK
Bilirubin, UA: NEGATIVE
Glucose, UA: NEGATIVE
Ketones, UA: NEGATIVE
Leukocytes, UA: NEGATIVE
Nitrite, UA: NEGATIVE
pH, UA: 5

## 2012-05-18 LAB — TSH: TSH: 4.656 u[IU]/mL — ABNORMAL HIGH (ref 0.350–4.500)

## 2012-05-18 MED ORDER — ESTRADIOL 0.05 MG/24HR TD PTTW: 1.0000 | MEDICATED_PATCH | TRANSDERMAL | Status: DC

## 2012-05-18 NOTE — Progress Notes (Signed)
52 y.o. G0P0 MarriedCaucasianF here for annual exam.  Doing well.  Traveling lots this summer--[place], [place], [place], [place].  Has pneumonia in July of last year.  Was on antibiotics for 10 days.  Dr. Pete Glatter is PCP.  Labs were okay.  Major complaint this year is urinary leakage.  Exercising a lot more.  Down 34 pounds over last two years.  Patient's last menstrual period was 05/30/2006.          Sexually active: yes  The current method of family planning is status post hysterectomy.    Exercising: yes  walking Smoker:  no  Health Maintenance: Pap:  08/03/06 WNL MMG:  11/26/11 3D normal Colonoscopy:  6/12 repeat in 10 years BMD:   none TDaP:  2005 Labs: 3/14 with Dr. Pete Glatter   reports that she has never smoked. She has never used smokeless tobacco. She reports that  drinks alcohol. She reports that she does not use illicit drugs.  Past Medical History  Diagnosis Date  . Hypertension   . Hypercholesteremia   . Asthma   . OSA on CPAP 09/2008    Past Surgical History  Procedure Laterality Date  . Cholecystectomy N/A 1994  . Gynecologic cryosurgery  1990's    cervix  . Meniscus repair  2006    Dr. Madelon Lips  . Nasal sinus surgery  2004    with uvula reduction  . Carpal tunnel release  2000  . Abdominal hysterectomy  11/208  . Cervical fusion  10/2007    C4-C5-C6, Dr. Danielle Dess    Current Outpatient Prescriptions  Medication Sig Dispense Refill  . Albuterol Sulfate (PROVENTIL HFA IN) Inhale 2 puffs into the lungs every 4 (four) hours as needed.      Marland Kitchen FIBER PO Take by mouth as needed.       . fluticasone (FLONASE) 50 MCG/ACT nasal spray Place 2 sprays into the nose daily.      . Glucosamine Sulfate (FP GLUCOSAMINE PO) Take by mouth daily.      . hydrochlorothiazide (MICROZIDE) 12.5 MG capsule Take 1 capsule by mouth daily.      Marland Kitchen KRILL OIL PO Take 1 tablet by mouth daily.      Marland Kitchen levocetirizine (XYZAL) 5 MG tablet Take 1 tablet by mouth daily.      Marland Kitchen lisinopril  (PRINIVIL,ZESTRIL) 10 MG tablet Take 1 tablet by mouth daily.      Marland Kitchen MINIVELLE 0.05 MG/24HR Place 1 patch onto the skin 2 (two) times a week.      . montelukast (SINGULAIR) 10 MG tablet Take 1 tablet by mouth at bedtime.      . Multiple Vitamin (MULTIVITAMIN) tablet Take 1 tablet by mouth daily.      Marland Kitchen omeprazole (PRILOSEC) 20 MG capsule Take 20 mg by mouth daily.      . Red Yeast Rice 600 MG TABS Take 1 tablet by mouth daily.       No current facility-administered medications for this visit.    Family History  Problem Relation Age of Onset  . Hypertension Mother   . Thyroid disease Mother   . Diabetes Father   . Hypertension Father   . Heart attack Father   . Hepatitis C Father   . Heart disease Father     open heart surgery 1992  . Thyroid disease Maternal Grandmother     ROS:  Pertinent items are noted in HPI.  Otherwise, a comprehensive ROS was negative.  Exam:   BP 136/82  Ht 5' 5.75" (1.67 m)  Wt 230 lb 6.4 oz (104.509 kg)  BMI 37.47 kg/m2  LMP 05/30/2006  Weight change: -17 pounds from one year ago (and -17 more from 2012) Height:   Height: 5' 5.75" (167 cm)  Ht Readings from Last 3 Encounters:  05/18/12 5' 5.75" (1.67 m)    General appearance: alert, cooperative and appears stated age Head: Normocephalic, without obvious abnormality, atraumatic Neck: no adenopathy, supple, symmetrical, trachea midline and thyroid normal to inspection and palpation Lungs: clear to auscultation bilaterally Breasts: normal appearance, no masses or tenderness Heart: regular rate and rhythm Abdomen: soft, non-tender; bowel sounds normal; no masses,  no organomegaly Extremities: extremities normal, atraumatic, no cyanosis or edema Skin: Skin color, texture, turgor normal. No rashes or lesions Lymph nodes: Cervical, supraclavicular, and axillary nodes normal. No abnormal inguinal nodes palpated Neurologic: Grossly normal   Pelvic: External genitalia:  no lesions               Urethra:  normal appearing urethra with no masses, tenderness or lesions              Bartholins and Skenes: normal                 Vagina: normal appearing vagina with normal color and discharge, no lesions              Cervix: absent              Pap taken: no Bimanual Exam:  Uterus:  uterus absent              Adnexa: no mass, fullness, tenderness               Rectovaginal: Confirms               Anus:  normal sphincter tone, no lesions  A:  Well Woman with normal exam Intentional weight loss H/o TAH 11/08 Htn Asthma Elevated lipids SUI  P:   Mammogram, will plan to do 3D MMG because of dense breasts pap smear not indicated due to h/o TAH On Minivelle 0.05mg  patches twice weekly.  RX to pharmacy PT referral  TSH return annually or prn  An After Visit Summary was printed and given to the patient.

## 2012-05-18 NOTE — Patient Instructions (Signed)

## 2012-05-19 ENCOUNTER — Encounter: Payer: Self-pay | Admitting: Obstetrics & Gynecology

## 2012-05-19 ENCOUNTER — Telehealth: Payer: Self-pay | Admitting: *Deleted

## 2012-05-19 NOTE — Telephone Encounter (Signed)
Left message on home CB# of date and time of apppt. With Ruben Gottron, [phone] @ 11:00am/sue. Patient to call [facility] to reschedule if needed.

## 2012-05-22 ENCOUNTER — Other Ambulatory Visit: Payer: Self-pay

## 2012-05-22 LAB — HEMOGLOBIN, FINGERSTICK: Hemoglobin, fingerstick: 13.2 g/dL (ref 12.0–16.0)

## 2012-05-22 MED ORDER — LEVOTHYROXINE SODIUM 50 MCG PO TABS: 50.0000 ug | ORAL_TABLET | Freq: Every day | ORAL | Status: DC

## 2012-05-22 NOTE — Telephone Encounter (Signed)
Patient to start Synthroid based on lab results, scheduled to come in for recheck in July. Sent to pharmacy as requested.

## 2012-05-22 NOTE — Addendum Note (Signed)
Addended by: Jerene Bears on: 05/22/2012 12:17 PM   Modules accepted: Orders

## 2012-06-06 ENCOUNTER — Telehealth: Payer: Self-pay | Admitting: *Deleted

## 2012-06-06 NOTE — Telephone Encounter (Signed)
Called [facility]. Scheduler stated paitent rescheduled for may 21st,2014.

## 2012-08-15 ENCOUNTER — Other Ambulatory Visit (INDEPENDENT_AMBULATORY_CARE_PROVIDER_SITE_OTHER): Payer: 59

## 2012-08-15 DIAGNOSIS — E039 Hypothyroidism, unspecified: Secondary | ICD-10-CM

## 2012-08-18 ENCOUNTER — Telehealth: Payer: Self-pay | Admitting: Obstetrics & Gynecology

## 2012-08-18 MED ORDER — LEVOTHYROXINE SODIUM 50 MCG PO TABS: 50.0000 ug | ORAL_TABLET | Freq: Every day | ORAL | Status: DC

## 2012-08-18 NOTE — Telephone Encounter (Signed)
Patient notified of all results. 

## 2012-08-18 NOTE — Telephone Encounter (Signed)
Patient calling in for results for TSH

## 2012-09-09 ENCOUNTER — Other Ambulatory Visit: Payer: Self-pay | Admitting: Obstetrics & Gynecology

## 2012-09-11 NOTE — Telephone Encounter (Signed)
08/18/12 #90/3 rf's was sent through to pharmacy.

## 2012-11-03 ENCOUNTER — Other Ambulatory Visit: Payer: Self-pay

## 2012-11-03 DIAGNOSIS — Z1231 Encounter for screening mammogram for malignant neoplasm of breast: Secondary | ICD-10-CM

## 2012-12-07 ENCOUNTER — Ambulatory Visit
Admission: RE | Admit: 2012-12-07 | Discharge: 2012-12-07 | Disposition: A | Discharge status: Home / Self Care | Payer: 59 | Source: Ambulatory Visit

## 2012-12-07 DIAGNOSIS — Z1231 Encounter for screening mammogram for malignant neoplasm of breast: Secondary | ICD-10-CM

## 2013-01-25 DIAGNOSIS — E119 Type 2 diabetes mellitus without complications: Secondary | ICD-10-CM

## 2013-01-25 HISTORY — DX: Type 2 diabetes mellitus without complications: E11.9

## 2013-05-30 ENCOUNTER — Other Ambulatory Visit: Payer: Self-pay | Admitting: Obstetrics & Gynecology

## 2013-05-30 NOTE — Telephone Encounter (Signed)
Last AEX and refill 05/18/12 #24/4 refills Next appt 05/31/2012   Will refused Rx. Patient coming in tomorrow. LM for pt re: Rx refused, discuss Rx at office visit tomorrow with Dr.

## 2013-05-30 NOTE — Telephone Encounter (Signed)
Patient called back and agreed to get Rx refilled tomorrow at her visit.

## 2013-05-31 ENCOUNTER — Encounter: Payer: Self-pay | Admitting: Obstetrics & Gynecology

## 2013-05-31 ENCOUNTER — Ambulatory Visit (INDEPENDENT_AMBULATORY_CARE_PROVIDER_SITE_OTHER): Payer: 59 | Admitting: Obstetrics & Gynecology

## 2013-05-31 VITALS — BP 120/64 | HR 60 | Resp 12 | Ht 65.5 in | Wt 234.2 lb

## 2013-05-31 DIAGNOSIS — Z01419 Encounter for gynecological examination (general) (routine) without abnormal findings: Secondary | ICD-10-CM

## 2013-05-31 MED ORDER — ESTRADIOL 0.05 MG/24HR TD PTTW: 1.0000 | MEDICATED_PATCH | TRANSDERMAL | Status: DC

## 2013-05-31 NOTE — Addendum Note (Signed)
Addended by: Megan Salon on: 05/31/2013 03:12 PM   Modules accepted: Orders

## 2013-05-31 NOTE — Progress Notes (Signed)
53 y.o. G0P0 MarriedCaucasianF here for annual exam.  Saw Dr. Felipa Eth and was diagnosed with diabetes.  On Metformin 500mg  XL dosing.  F/U scheduled in six months.  Working with Physiological scientist over the past year.    Patient's last menstrual period was 05/30/2006.          Sexually active: yes  The current method of family planning is status post hysterectomy.    Exercising: yes  workout with personal trainer 2-3 x per week Smoker:  no  Health Maintenance: Pap:  08/03/06 WNL History of abnormal Pap:  Yes-years ago/cryosurgery MMG:  12/07/12-normal Colonoscopy:  2012-repeat in 10 years, Dr. Earlean Shawl BMD:   none TDaP:  4/15 Screening Labs: PCP, Hb today: PCP, Urine today: PCP   reports that she has never smoked. She has never used smokeless tobacco. She reports that she drinks alcohol. She reports that she does not use illicit drugs.  Past Medical History  Diagnosis Date  . Hypertension   . Hypercholesteremia   . Asthma   . OSA on CPAP 09/2008  . Diabetes     prediabetes-on meds    Past Surgical History  Procedure Laterality Date  . Cholecystectomy N/A 1994  . Gynecologic cryosurgery  1990's    cervix  . Meniscus repair  2006    Dr. French Ana  . Nasal sinus surgery  2004    with uvula reduction  . Carpal tunnel release  2000  . Abdominal hysterectomy  11/208  . Cervical fusion  10/2007    C4-C5-C6, Dr. Ellene Route    Current Outpatient Prescriptions  Medication Sig Dispense Refill  . Albuterol Sulfate (PROVENTIL HFA IN) Inhale 2 puffs into the lungs every 4 (four) hours as needed.      . betamethasone dipropionate (DIPROLENE) 0.05 % cream       . estradiol (MINIVELLE) 0.05 MG/24HR Place 1 patch (0.05 mg total) onto the skin 2 (two) times a week.  24 patch  4  . FIBER PO Take by mouth as needed.       . Fluticasone-Salmeterol (ADVAIR) 250-50 MCG/DOSE AEPB Inhale 1 puff into the lungs 2 (two) times daily.      . Glucosamine Sulfate (FP GLUCOSAMINE PO) Take by mouth daily.       . hydrochlorothiazide (MICROZIDE) 12.5 MG capsule Take 1 capsule by mouth daily.      . hydrOXYzine (ATARAX/VISTARIL) 25 MG tablet       . LASTACAFT 0.25 % SOLN       . levocetirizine (XYZAL) 5 MG tablet Take 1 tablet by mouth daily.      Marland Kitchen levothyroxine (SYNTHROID, LEVOTHROID) 50 MCG tablet Take 1 tablet (50 mcg total) by mouth daily.  90 tablet  3  . lisinopril (PRINIVIL,ZESTRIL) 10 MG tablet Take 1 tablet by mouth daily.      . meclizine (ANTIVERT) 25 MG tablet Take 25 mg by mouth 3 (three) times daily as needed for dizziness.      . metFORMIN (GLUCOPHAGE-XR) 500 MG 24 hr tablet 500 mg daily.      . montelukast (SINGULAIR) 10 MG tablet Take 1 tablet by mouth at bedtime.      . Multiple Vitamin (MULTIVITAMIN) tablet Take 1 tablet by mouth daily.      Marland Kitchen omeprazole (PRILOSEC) 20 MG capsule Take 20 mg by mouth daily.      . Red Yeast Rice 600 MG TABS Take 1 tablet by mouth daily.      Marland Kitchen triamcinolone cream (KENALOG) 0.1 %  No current facility-administered medications for this visit.    Family History  Problem Relation Age of Onset  . Hypertension Mother   . Thyroid disease Mother   . Diabetes Father   . Hypertension Father   . Heart attack Father   . Hepatitis C Father   . Heart disease Father     open heart surgery 1992  . Thyroid disease Maternal Grandmother     ROS:  Pertinent items are noted in HPI.  Otherwise, a comprehensive ROS was negative.  Exam:   BP 120/64  Pulse 60  Resp 12  Ht 5' 5.5" (1.664 m)  Wt 234 lb 3.2 oz (106.232 kg)  BMI 38.37 kg/m2  LMP 05/30/2006  Weight change: +2#   Height: 5' 5.5" (166.4 cm)  Ht Readings from Last 3 Encounters:  05/31/13 5' 5.5" (1.664 m)  05/18/12 5' 5.75" (1.67 m)    General appearance: alert, cooperative and appears stated age Head: Normocephalic, without obvious abnormality, atraumatic Neck: no adenopathy, supple, symmetrical, trachea midline and thyroid normal to inspection and palpation Lungs: clear to  auscultation bilaterally Breasts: normal appearance, no masses or tenderness Heart: regular rate and rhythm Abdomen: soft, non-tender; bowel sounds normal; no masses,  no organomegaly Extremities: extremities normal, atraumatic, no cyanosis or edema Skin: Skin color, texture, turgor normal. No rashes or lesions Lymph nodes: Cervical, supraclavicular, and axillary nodes normal. No abnormal inguinal nodes palpated Neurologic: Grossly normal   Pelvic: External genitalia:  no lesions              Urethra:  normal appearing urethra with no masses, tenderness or lesions              Bartholins and Skenes: normal                 Vagina: normal appearing vagina with normal color and discharge, no lesions              Cervix: absent              Pap taken: no Bimanual Exam:  Uterus:  uterus absent              Adnexa: normal adnexa and no mass, fullness, tenderness               Rectovaginal: Confirms               Anus:  normal sphincter tone, no lesions  A:  Well Woman with normal exam H/O TAH 11/08 Hemorrhoids, external Hypertension Asthma Elevated lipids  P:   Mammogram yearly.  D/W 3D MMG due to grade 3 breast density pap smear not indicated Labs in six months with Dr. Murray Hodgkins 0.05mg  patches twice weekly.  #90/4RF.  Pt may try to taper down the dose this year. return annually or prn  An After Visit Summary was printed and given to the patient.

## 2013-06-01 ENCOUNTER — Other Ambulatory Visit: Payer: Self-pay | Admitting: Obstetrics & Gynecology

## 2013-06-01 ENCOUNTER — Telehealth: Payer: Self-pay | Admitting: Obstetrics & Gynecology

## 2013-06-01 NOTE — Telephone Encounter (Signed)
RX for Minivelle sent on 05/31/13 to [facility].  I spoke to Maddie at [facility], she states pt has RX for [employer] on file.  She states [facility] has already processed 90 day claim and will not pay for 30 day supply.  I spoke to the patient to see if she has enough patches on hand to last until mail order arrives.  She states she has 3 patches and will change today, Tuesday, and next Friday.  Pt is leaving on Saturday for [facility] and needed these patches before leaving.  Pt states [facility] has communicated with [facility] and they filled RX for Vivelle Dot yesterday and not Minivelle and this was not discovered until pt tried to pick up RX today.  Pt will wait for mail order supply to arrive.  If shipment has not been received by Wednesday or Thursday she will call us for Vivelle Dot RX that she can pick up before she leaves for vacation.

## 2013-08-28 ENCOUNTER — Ambulatory Visit: Payer: 59 | Admitting: Obstetrics & Gynecology

## 2013-10-04 ENCOUNTER — Other Ambulatory Visit: Payer: Self-pay | Admitting: Obstetrics & Gynecology

## 2013-10-04 NOTE — Telephone Encounter (Signed)
Last AEX: 05/31/13 Last refill:08/18/12 #90 X 3  Current AEX:06/03/14 Last TSH: 05/16/13 1.46  Please advise

## 2013-11-09 ENCOUNTER — Other Ambulatory Visit: Payer: Self-pay

## 2013-11-19 ENCOUNTER — Other Ambulatory Visit: Payer: Self-pay

## 2013-11-19 DIAGNOSIS — Z1231 Encounter for screening mammogram for malignant neoplasm of breast: Secondary | ICD-10-CM

## 2013-12-17 ENCOUNTER — Ambulatory Visit
Admission: RE | Admit: 2013-12-17 | Discharge: 2013-12-17 | Disposition: A | Discharge status: Home / Self Care | Payer: 59 | Source: Ambulatory Visit

## 2013-12-17 DIAGNOSIS — Z1231 Encounter for screening mammogram for malignant neoplasm of breast: Secondary | ICD-10-CM

## 2014-01-25 DIAGNOSIS — E7800 Pure hypercholesterolemia, unspecified: Secondary | ICD-10-CM

## 2014-01-25 HISTORY — DX: Pure hypercholesterolemia, unspecified: E78.00

## 2014-04-18 ENCOUNTER — Other Ambulatory Visit: Payer: Self-pay | Admitting: Dermatology

## 2014-06-03 ENCOUNTER — Ambulatory Visit: Payer: 59 | Admitting: Obstetrics & Gynecology

## 2014-07-25 ENCOUNTER — Ambulatory Visit: Payer: Self-pay | Admitting: Nurse Practitioner

## 2014-07-25 ENCOUNTER — Ambulatory Visit (INDEPENDENT_AMBULATORY_CARE_PROVIDER_SITE_OTHER): Payer: 59 | Admitting: Nurse Practitioner

## 2014-07-25 ENCOUNTER — Encounter: Payer: Self-pay | Admitting: Nurse Practitioner

## 2014-07-25 VITALS — BP 118/74 | HR 72 | Ht 65.5 in | Wt 243.0 lb

## 2014-07-25 DIAGNOSIS — R5383 Other fatigue: Secondary | ICD-10-CM

## 2014-07-25 DIAGNOSIS — Z0000 Encounter for general adult medical examination without abnormal findings: Secondary | ICD-10-CM

## 2014-07-25 DIAGNOSIS — Z01419 Encounter for gynecological examination (general) (routine) without abnormal findings: Secondary | ICD-10-CM | POA: Diagnosis not present

## 2014-07-25 MED ORDER — ESTRADIOL 0.05 MG/24HR TD PTTW: 1.0000 | MEDICATED_PATCH | TRANSDERMAL | Status: DC

## 2014-07-25 NOTE — Progress Notes (Signed)
Encounter reviewed by Dr. Brook Amundson C. Silva.  

## 2014-07-25 NOTE — Patient Instructions (Signed)

## 2014-07-25 NOTE — Progress Notes (Signed)
Patient ID: [name], female   DOB: May 09, 1960, 54 y.o.   MRN: [id number] 54 y.o. G0P0 Married  Caucasian Fe here for annual exam.  She is feeling 'extremely tired'.  Sleep is good. She is concerned about 'everything causing her hearing to be more sensitive'.  She had earlier problems with eustachian tube dysfunction.  She will be gong back to ENT.  She is retired and wants to continue ERT even though she is tapering.  Currently at 1/2 -1/3 of a patch.  Patient's last menstrual period was 05/30/2006 (approximate).          Sexually active: Yes.    The current method of family planning is status post hysterectomy.    Exercising: Yes.    walking 1-3 times per week, and stairs in house Smoker:  no  Health Maintenance: Pap:  08/03/06, Negative (history of abnormal pap with cryo years ago) MMG:  12/17/13, Bi-Rads 1:  Negative Colonoscopy:  2012, repeat in 10 years, Dr. Earlean Shawl TDaP:  04/2013 Labs:  PCP, except for thyroid labs   reports that she has never smoked. She has never used smokeless tobacco. She reports that she drinks alcohol. She reports that she does not use illicit drugs.  Past Medical History  Diagnosis Date  . Hypercholesteremia 2016  . Asthma   . OSA on CPAP 09/2008  . Abnormal Pap smear of cervix     cryo 1990's  . Diabetes 2015    prediabetes-on meds  . Hypertension 40's    Past Surgical History  Procedure Laterality Date  . Laparoscopic cholecystectomy  1994  . Gynecologic cryosurgery  1990's    cervix  . Meniscus repair Right 2006    Dr. French Ana  . Nasal sinus surgery  2004    with uvula reduction  . Carpal tunnel release Right 2000  . Cervical fusion  10/2007    C4-C5-C6, Dr. Ellene Route  . Abdominal hysterectomy  11/208    fibroids, ovaries remain    Current Outpatient Prescriptions  Medication Sig Dispense Refill  . atorvastatin (LIPITOR) 20 MG tablet Take 1 tablet by mouth daily.    . betamethasone dipropionate (DIPROLENE) 0.05 % cream     . estradiol  (MINIVELLE) 0.05 MG/24HR patch Place 1 patch (0.05 mg total) onto the skin 2 (two) times a week. 24 patch 3  . FIBER PO Take by mouth as needed.     . Glucosamine Sulfate (FP GLUCOSAMINE PO) Take by mouth daily.    . hydrochlorothiazide (MICROZIDE) 12.5 MG capsule Take 1 capsule by mouth daily.    Marland Kitchen levocetirizine (XYZAL) 5 MG tablet Take 1 tablet by mouth daily.    Marland Kitchen levothyroxine (SYNTHROID, LEVOTHROID) 50 MCG tablet Take 1 tablet by mouth  daily 90 tablet 4  . lisinopril (PRINIVIL,ZESTRIL) 10 MG tablet Take 1 tablet by mouth daily.    . metFORMIN (GLUCOPHAGE-XR) 500 MG 24 hr tablet 500 mg daily.    . montelukast (SINGULAIR) 10 MG tablet Take 1 tablet by mouth at bedtime.    . Multiple Vitamin (MULTIVITAMIN) tablet Take 1 tablet by mouth daily.    Marland Kitchen omeprazole (PRILOSEC) 20 MG capsule Take 20 mg by mouth daily.    . Red Yeast Rice 600 MG TABS Take 1 tablet by mouth daily.    . VENTOLIN HFA 108 (90 BASE) MCG/ACT inhaler Inhale 2 puffs into the lungs every 4 (four) hours as needed.     No current facility-administered medications for this visit.    Family  History  Problem Relation Age of Onset  . Hypertension Mother   . Thyroid disease Mother   . Hyperlipidemia Mother   . Diabetes Father   . Hypertension Father   . Heart attack Father   . Hepatitis C Father   . Heart disease Father     open heart surgery 1982  . Hyperlipidemia Father   . Thyroid disease Maternal Grandmother   . Cancer Maternal Grandfather     related to asbestos and complications with throat/ esophageal cancer    ROS:  Pertinent items are noted in HPI.  Otherwise, a comprehensive ROS was negative.  Exam:   BP 118/74 mmHg  Pulse 72  Ht 5' 5.5" (1.664 m)  Wt 243 lb (110.224 kg)  BMI 39.81 kg/m2  LMP 05/30/2006 (Approximate) Height: 5' 5.5" (166.4 cm) Ht Readings from Last 3 Encounters:  07/25/14 5' 5.5" (1.664 m)  05/31/13 5' 5.5" (1.664 m)  05/18/12 5' 5.75" (1.67 m)    General appearance: alert,  cooperative and appears stated age Head: Normocephalic, without obvious abnormality, atraumatic Neck: no adenopathy, supple, symmetrical, trachea midline and thyroid normal to inspection and palpation Lungs: clear to auscultation bilaterally Breasts: normal appearance, no masses or tenderness Heart: regular rate and rhythm Abdomen: soft, non-tender; no masses,  no organomegaly Extremities: extremities normal, atraumatic, no cyanosis or edema Skin: Skin color, texture, turgor normal. No rashes or lesions Lymph nodes: Cervical, supraclavicular, and axillary nodes normal. No abnormal inguinal nodes palpated Neurologic: Grossly normal   Pelvic: External genitalia:  no lesions              Urethra:  normal appearing urethra with no masses, tenderness or lesions              Bartholin's and Skene's: normal                 Vagina: normal appearing vagina with normal color and discharge, no lesions              Cervix: absent              Pap taken: No. Bimanual Exam:  Uterus:  uterus absent              Adnexa: no mass, fullness, tenderness               Rectovaginal: declines               Anus:  Declines secondary to recent flare of hemorrhoid  Chaperone present:  no  A:  Well Woman with normal exam  H/O TAH 11/08 on ERT - tapering dose  Hemorrhoids, external  Hypertension  Asthma  Elevated lipids  Hypersensitive to noise  P:   Reviewed health and wellness pertinent to exam  Pap smear as above  Mammogram is due 11/16  Refill on Minivelle patch 0.05 mg patch for a year  Counseled on risk of DVT, CVA, cancer, etc.  She will also contact her ENT and follow  Will follow with labs.  Counseled on breast self exam, mammography screening, use and side effects of HRT, adequate intake of calcium and vitamin D, diet and exercise return annually or prn  An After Visit Summary was printed and given to the patient.

## 2014-07-26 LAB — COMPREHENSIVE METABOLIC PANEL
ALT: 25 U/L (ref 0–35)
AST: 25 U/L (ref 0–37)
Albumin: 4 g/dL (ref 3.5–5.2)
Alkaline Phosphatase: 51 U/L (ref 39–117)
BILIRUBIN TOTAL: 0.4 mg/dL (ref 0.2–1.2)
BUN: 15 mg/dL (ref 6–23)
CO2: 28 mEq/L (ref 19–32)
CREATININE: 0.77 mg/dL (ref 0.50–1.10)
Calcium: 9.4 mg/dL (ref 8.4–10.5)
Chloride: 102 mEq/L (ref 96–112)
Glucose, Bld: 133 mg/dL — ABNORMAL HIGH (ref 70–99)
Potassium: 4.3 mEq/L (ref 3.5–5.3)
Sodium: 142 mEq/L (ref 135–145)
Total Protein: 7.2 g/dL (ref 6.0–8.3)

## 2014-07-26 LAB — THYROID PANEL WITH TSH
FREE THYROXINE INDEX: 2.3 (ref 1.4–3.8)
T3 Uptake: 27 % (ref 22–35)
T4, Total: 8.5 ug/dL (ref 4.5–12.0)
TSH: 2.64 u[IU]/mL (ref 0.350–4.500)

## 2014-10-19 ENCOUNTER — Other Ambulatory Visit: Payer: Self-pay | Admitting: Obstetrics & Gynecology

## 2014-10-21 NOTE — Telephone Encounter (Signed)
Medication refill request: Synthroid  Last AEX:  07/25/14 PG Next AEX: 10/24/15 SM Last MMG (if hormonal medication request): 12/17/13 BIRADS1:neg Refill authorized: 10/05/13 #90tabs w/ 4R. Today #3pack/3R?

## 2014-10-23 ENCOUNTER — Other Ambulatory Visit: Payer: Self-pay | Admitting: Pediatrics

## 2014-10-23 MED ORDER — MONTELUKAST SODIUM 10 MG PO TABS: 10.0000 mg | ORAL_TABLET | Freq: Every day | ORAL | Status: DC

## 2014-11-20 ENCOUNTER — Other Ambulatory Visit: Payer: Self-pay

## 2014-11-20 DIAGNOSIS — Z1231 Encounter for screening mammogram for malignant neoplasm of breast: Secondary | ICD-10-CM

## 2014-12-23 ENCOUNTER — Ambulatory Visit
Admission: RE | Admit: 2014-12-23 | Discharge: 2014-12-23 | Disposition: A | Discharge status: Home / Self Care | Payer: 59 | Source: Ambulatory Visit

## 2014-12-23 DIAGNOSIS — Z1231 Encounter for screening mammogram for malignant neoplasm of breast: Secondary | ICD-10-CM

## 2014-12-25 ENCOUNTER — Other Ambulatory Visit: Payer: Self-pay | Admitting: Geriatric Medicine

## 2014-12-25 DIAGNOSIS — R928 Other abnormal and inconclusive findings on diagnostic imaging of breast: Secondary | ICD-10-CM

## 2014-12-31 ENCOUNTER — Ambulatory Visit
Admission: RE | Admit: 2014-12-31 | Discharge: 2014-12-31 | Disposition: A | Discharge status: Home / Self Care | Payer: 59 | Source: Ambulatory Visit | Attending: Geriatric Medicine | Admitting: Geriatric Medicine

## 2014-12-31 DIAGNOSIS — R928 Other abnormal and inconclusive findings on diagnostic imaging of breast: Secondary | ICD-10-CM

## 2015-04-21 ENCOUNTER — Other Ambulatory Visit: Payer: Self-pay | Admitting: Pediatrics

## 2015-05-15 ENCOUNTER — Other Ambulatory Visit: Payer: Self-pay | Admitting: Pediatrics

## 2015-10-03 ENCOUNTER — Other Ambulatory Visit: Payer: Self-pay | Admitting: Nurse Practitioner

## 2015-10-06 NOTE — Telephone Encounter (Signed)
Medication refill request: Levothyroxine Last AEX:  07/25/14 PG Next AEX: 10/24/15 SM Last MMG (if hormonal medication request): 12/23/14 BIRADS0; 12/31/14 Dx L Breast 3D-BIRADS2 [facility] Refill authorized: 10/21/14 #90 3R. Please advise. Thank you.

## 2015-10-24 ENCOUNTER — Ambulatory Visit (INDEPENDENT_AMBULATORY_CARE_PROVIDER_SITE_OTHER): Payer: Commercial Managed Care - HMO | Admitting: Obstetrics & Gynecology

## 2015-10-24 ENCOUNTER — Encounter: Payer: Self-pay | Admitting: Obstetrics & Gynecology

## 2015-10-24 VITALS — BP 120/66 | HR 72 | Resp 16 | Ht 65.5 in | Wt 204.0 lb

## 2015-10-24 DIAGNOSIS — Z01419 Encounter for gynecological examination (general) (routine) without abnormal findings: Secondary | ICD-10-CM | POA: Diagnosis not present

## 2015-10-24 DIAGNOSIS — Z205 Contact with and (suspected) exposure to viral hepatitis: Secondary | ICD-10-CM

## 2015-10-24 NOTE — Progress Notes (Signed)
55 y.o. G0P0 MarriedCaucasianF here for annual exam.  Doing well.  No vaginal bleeding.  Has lost almost 50 pounds with weight watchers.    PCP:  Dr. Felipa Eth.  Blood work was normal, per pt.  Last HbA1C was 6.0.  05/26/15.  Has follow up in November.  Patient's last menstrual period was 05/30/2006 (approximate).          Sexually active: Yes.    The current method of family planning is status post hysterectomy.    Exercising: No.  The patient does not participate in regular exercise at present. Smoker:  no  Health Maintenance: Pap:  08/03/06 Neg History of abnormal Pap:  yes MMG:  12/31/14 Diagnostic Left BIRADS2:Benign  Colonoscopy:  07/20/10 Normal, 10 year follow up.  Dr. Earlean Shawl BMD:   never TDaP:  04/2013  Pneumonia vaccine(s):  2016  Zostavax:   No Hep C testing: will do today Screening Labs: PCP   reports that she has never smoked. She has never used smokeless tobacco. She reports that she drinks alcohol. She reports that she does not use drugs.  Past Medical History:  Diagnosis Date  . Abnormal Pap smear of cervix    cryo 1990's  . Asthma   . Diabetes ([place]) 2015   prediabetes-on meds  . Hypercholesteremia 2016  . Hypertension 40's  . OSA on CPAP 09/2008    Past Surgical History:  Procedure Laterality Date  . ABDOMINAL HYSTERECTOMY  11/208   fibroids, ovaries remain  . CARPAL TUNNEL RELEASE Right 2000  . CERVICAL FUSION  10/2007   C4-C5-C6, Dr. Ellene Route  . GYNECOLOGIC CRYOSURGERY  1990's   cervix  . LAPAROSCOPIC CHOLECYSTECTOMY  1994  . MENISCUS REPAIR Right 2006   Dr. French Ana  . NASAL SINUS SURGERY  2004   with uvula reduction    Current Outpatient Prescriptions  Medication Sig Dispense Refill  . atorvastatin (LIPITOR) 20 MG tablet Take 1 tablet by mouth daily.    Marland Kitchen FIBER PO Take by mouth as needed.     . Glucosamine Sulfate (FP GLUCOSAMINE PO) Take by mouth daily.    . hydrochlorothiazide (MICROZIDE) 12.5 MG capsule Take 1 capsule by mouth daily.    Marland Kitchen  levothyroxine (SYNTHROID, LEVOTHROID) 50 MCG tablet Take 1 tablet by mouth  daily 90 tablet 0  . lisinopril (PRINIVIL,ZESTRIL) 10 MG tablet Take 1 tablet by mouth daily.    . metFORMIN (GLUCOPHAGE-XR) 500 MG 24 hr tablet 500 mg daily.    . Multiple Vitamin (MULTIVITAMIN) tablet Take 1 tablet by mouth daily.     No current facility-administered medications for this visit.     Family History  Problem Relation Age of Onset  . Hypertension Mother   . Thyroid disease Mother   . Hyperlipidemia Mother   . Diabetes Father   . Hypertension Father   . Heart attack Father   . Hepatitis C Father   . Heart disease Father     open heart surgery 1982  . Hyperlipidemia Father   . Thyroid disease Maternal Grandmother   . Cancer Maternal Grandfather     related to asbestos and complications with throat/ esophageal cancer    ROS:  Pertinent items are noted in HPI.  Otherwise, a comprehensive ROS was negative.  Exam:   BP 120/66 (BP Location: Right Arm, Patient Position: Sitting, Cuff Size: Large)   Pulse 72   Resp 16   Ht 5' 5.5" (1.664 m)   Wt 204 lb (92.5 kg)   LMP 05/30/2006 (  Approximate)   BMI 33.43 kg/m   Weight change: -40# Height: 5' 5.5" (166.4 cm)  Ht Readings from Last 3 Encounters:  10/24/15 5' 5.5" (1.664 m)  07/25/14 5' 5.5" (1.664 m)  05/31/13 5' 5.5" (1.664 m)   General appearance: alert, cooperative and appears stated age Head: Normocephalic, without obvious abnormality, atraumatic Neck: no adenopathy, supple, symmetrical, trachea midline and thyroid normal to inspection and palpation Lungs: clear to auscultation bilaterally Breasts: normal appearance, no masses or tenderness Heart: regular rate and rhythm Abdomen: soft, non-tender; bowel sounds normal; no masses,  no organomegaly Extremities: extremities normal, atraumatic, no cyanosis or edema Skin: Skin color, texture, turgor normal. No rashes or lesions Lymph nodes: Cervical, supraclavicular, and axillary nodes  normal. No abnormal inguinal nodes palpated Neurologic: Grossly normal   Pelvic: External genitalia:  no lesions              Urethra:  normal appearing urethra with no masses, tenderness or lesions              Bartholins and Skenes: normal                 Vagina: normal appearing vagina with normal color and discharge, no lesions              Cervix: absent              Pap taken: No. Bimanual Exam:  Uterus:  uterus absent              Adnexa: no mass, fullness, tenderness               Rectovaginal: Confirms               Anus:  normal sphincter tone, no lesions  Chaperone was present for exam.  A:     Well Woman with normal exam H/O TAH 11/08 Type 2 DM Hypertension Asthma Elevated lipids Intentional weight loss  P:         Mammogram yearly.  Now doing 3D MMG due to grade 3 breast density pap smear not indicated Labs in November with Dr. Felipa Eth Hep C obtained Return annually or prn

## 2015-10-25 LAB — HEPATITIS C ANTIBODY: HCV AB: NEGATIVE

## 2015-12-25 ENCOUNTER — Other Ambulatory Visit: Payer: Self-pay | Admitting: Nurse Practitioner

## 2015-12-26 NOTE — Telephone Encounter (Signed)
Medication refill request: Synthroid  Last AEX:  10/24/15 SM  Next AEX: 02/10/17 SM  Last MMG (if hormonal medication request): 12/31/14 BIRADS2:Benign  Refill authorized: 10/06/15 #90tabs/0R. Today please advise.

## 2016-01-27 DIAGNOSIS — M7501 Adhesive capsulitis of right shoulder: Secondary | ICD-10-CM | POA: Diagnosis not present

## 2016-01-27 DIAGNOSIS — M7541 Impingement syndrome of right shoulder: Secondary | ICD-10-CM | POA: Diagnosis not present

## 2016-01-27 DIAGNOSIS — M25511 Pain in right shoulder: Secondary | ICD-10-CM | POA: Diagnosis not present

## 2016-02-01 ENCOUNTER — Other Ambulatory Visit: Payer: Self-pay | Admitting: Obstetrics & Gynecology

## 2016-02-02 ENCOUNTER — Other Ambulatory Visit: Payer: Self-pay | Admitting: Obstetrics & Gynecology

## 2016-02-02 DIAGNOSIS — M25511 Pain in right shoulder: Secondary | ICD-10-CM | POA: Diagnosis not present

## 2016-02-02 DIAGNOSIS — Z1231 Encounter for screening mammogram for malignant neoplasm of breast: Secondary | ICD-10-CM

## 2016-02-02 DIAGNOSIS — M7501 Adhesive capsulitis of right shoulder: Secondary | ICD-10-CM | POA: Diagnosis not present

## 2016-02-02 DIAGNOSIS — M7541 Impingement syndrome of right shoulder: Secondary | ICD-10-CM | POA: Diagnosis not present

## 2016-02-03 ENCOUNTER — Telehealth: Payer: Self-pay | Admitting: Obstetrics & Gynecology

## 2016-02-03 DIAGNOSIS — M25511 Pain in right shoulder: Secondary | ICD-10-CM | POA: Diagnosis not present

## 2016-02-03 NOTE — Telephone Encounter (Signed)
Spoke with patient and advised that request was too early. Advised patient to request refill closer to the end of her prescription.

## 2016-02-03 NOTE — Telephone Encounter (Signed)
Patient calling regarding refill for the levothyroxine that [facility] said she needed to contact the office about.

## 2016-02-06 DIAGNOSIS — M25511 Pain in right shoulder: Secondary | ICD-10-CM | POA: Diagnosis not present

## 2016-02-09 ENCOUNTER — Ambulatory Visit
Admission: RE | Admit: 2016-02-09 | Discharge: 2016-02-09 | Disposition: A | Discharge status: Home / Self Care | Payer: 59 | Source: Ambulatory Visit | Attending: Obstetrics & Gynecology | Admitting: Obstetrics & Gynecology

## 2016-02-09 DIAGNOSIS — Z1231 Encounter for screening mammogram for malignant neoplasm of breast: Secondary | ICD-10-CM | POA: Diagnosis not present

## 2016-02-09 DIAGNOSIS — M25511 Pain in right shoulder: Secondary | ICD-10-CM | POA: Diagnosis not present

## 2016-03-10 DIAGNOSIS — M7501 Adhesive capsulitis of right shoulder: Secondary | ICD-10-CM | POA: Diagnosis not present

## 2016-03-18 ENCOUNTER — Other Ambulatory Visit: Payer: Self-pay | Admitting: Obstetrics & Gynecology

## 2016-03-19 NOTE — Telephone Encounter (Signed)
Medication refill request: Levothyroxine Last AEX:  10/24/15 SM Next AEX: 02/10/17 SM Last MMG (if hormonal medication request): 02/09/16  BIRADS1, Density C, TBC Refill authorized: 12/29/15 #90 0R. Please advise. Thank you.

## 2016-04-12 DIAGNOSIS — M7501 Adhesive capsulitis of right shoulder: Secondary | ICD-10-CM | POA: Diagnosis not present

## 2016-05-10 DIAGNOSIS — J302 Other seasonal allergic rhinitis: Secondary | ICD-10-CM | POA: Diagnosis not present

## 2016-05-10 DIAGNOSIS — G4733 Obstructive sleep apnea (adult) (pediatric): Secondary | ICD-10-CM | POA: Diagnosis not present

## 2016-05-24 DIAGNOSIS — M7501 Adhesive capsulitis of right shoulder: Secondary | ICD-10-CM | POA: Diagnosis not present

## 2016-05-25 HISTORY — PX: OTHER SURGICAL HISTORY: SHX169

## 2016-05-27 DIAGNOSIS — G4733 Obstructive sleep apnea (adult) (pediatric): Secondary | ICD-10-CM | POA: Diagnosis not present

## 2016-06-01 DIAGNOSIS — I10 Essential (primary) hypertension: Secondary | ICD-10-CM | POA: Diagnosis not present

## 2016-06-01 DIAGNOSIS — K219 Gastro-esophageal reflux disease without esophagitis: Secondary | ICD-10-CM | POA: Diagnosis not present

## 2016-06-01 DIAGNOSIS — Z0000 Encounter for general adult medical examination without abnormal findings: Secondary | ICD-10-CM | POA: Diagnosis not present

## 2016-06-02 DIAGNOSIS — E119 Type 2 diabetes mellitus without complications: Secondary | ICD-10-CM | POA: Diagnosis not present

## 2016-06-02 DIAGNOSIS — E7800 Pure hypercholesterolemia, unspecified: Secondary | ICD-10-CM | POA: Diagnosis not present

## 2016-06-02 DIAGNOSIS — E038 Other specified hypothyroidism: Secondary | ICD-10-CM | POA: Diagnosis not present

## 2016-06-02 DIAGNOSIS — Z79899 Other long term (current) drug therapy: Secondary | ICD-10-CM | POA: Diagnosis not present

## 2016-06-02 DIAGNOSIS — I10 Essential (primary) hypertension: Secondary | ICD-10-CM | POA: Diagnosis not present

## 2016-06-22 DIAGNOSIS — E139 Other specified diabetes mellitus without complications: Secondary | ICD-10-CM | POA: Diagnosis not present

## 2016-06-27 DIAGNOSIS — G4733 Obstructive sleep apnea (adult) (pediatric): Secondary | ICD-10-CM | POA: Diagnosis not present

## 2016-07-07 DIAGNOSIS — M7501 Adhesive capsulitis of right shoulder: Secondary | ICD-10-CM | POA: Diagnosis not present

## 2016-07-13 DIAGNOSIS — M7501 Adhesive capsulitis of right shoulder: Secondary | ICD-10-CM | POA: Diagnosis not present

## 2016-07-13 DIAGNOSIS — G8918 Other acute postprocedural pain: Secondary | ICD-10-CM | POA: Diagnosis not present

## 2016-07-14 DIAGNOSIS — M25511 Pain in right shoulder: Secondary | ICD-10-CM | POA: Diagnosis not present

## 2016-07-14 DIAGNOSIS — Z4789 Encounter for other orthopedic aftercare: Secondary | ICD-10-CM | POA: Diagnosis not present

## 2016-07-14 DIAGNOSIS — M7501 Adhesive capsulitis of right shoulder: Secondary | ICD-10-CM | POA: Diagnosis not present

## 2016-07-15 DIAGNOSIS — M7501 Adhesive capsulitis of right shoulder: Secondary | ICD-10-CM | POA: Diagnosis not present

## 2016-07-15 DIAGNOSIS — Z4789 Encounter for other orthopedic aftercare: Secondary | ICD-10-CM | POA: Diagnosis not present

## 2016-07-15 DIAGNOSIS — M25511 Pain in right shoulder: Secondary | ICD-10-CM | POA: Diagnosis not present

## 2016-07-20 DIAGNOSIS — M25511 Pain in right shoulder: Secondary | ICD-10-CM | POA: Diagnosis not present

## 2016-07-20 DIAGNOSIS — Z4789 Encounter for other orthopedic aftercare: Secondary | ICD-10-CM | POA: Diagnosis not present

## 2016-07-20 DIAGNOSIS — M7501 Adhesive capsulitis of right shoulder: Secondary | ICD-10-CM | POA: Diagnosis not present

## 2016-07-22 DIAGNOSIS — M7501 Adhesive capsulitis of right shoulder: Secondary | ICD-10-CM | POA: Diagnosis not present

## 2016-07-22 DIAGNOSIS — M25511 Pain in right shoulder: Secondary | ICD-10-CM | POA: Diagnosis not present

## 2016-07-22 DIAGNOSIS — Z4789 Encounter for other orthopedic aftercare: Secondary | ICD-10-CM | POA: Diagnosis not present

## 2016-07-23 DIAGNOSIS — M7501 Adhesive capsulitis of right shoulder: Secondary | ICD-10-CM | POA: Diagnosis not present

## 2016-07-23 DIAGNOSIS — Z4789 Encounter for other orthopedic aftercare: Secondary | ICD-10-CM | POA: Diagnosis not present

## 2016-07-23 DIAGNOSIS — M25511 Pain in right shoulder: Secondary | ICD-10-CM | POA: Diagnosis not present

## 2016-07-26 DIAGNOSIS — M25511 Pain in right shoulder: Secondary | ICD-10-CM | POA: Diagnosis not present

## 2016-07-26 DIAGNOSIS — Z4789 Encounter for other orthopedic aftercare: Secondary | ICD-10-CM | POA: Diagnosis not present

## 2016-07-26 DIAGNOSIS — M7501 Adhesive capsulitis of right shoulder: Secondary | ICD-10-CM | POA: Diagnosis not present

## 2016-07-27 DIAGNOSIS — M25511 Pain in right shoulder: Secondary | ICD-10-CM | POA: Diagnosis not present

## 2016-07-27 DIAGNOSIS — Z4789 Encounter for other orthopedic aftercare: Secondary | ICD-10-CM | POA: Diagnosis not present

## 2016-07-27 DIAGNOSIS — M7501 Adhesive capsulitis of right shoulder: Secondary | ICD-10-CM | POA: Diagnosis not present

## 2016-07-29 DIAGNOSIS — M25511 Pain in right shoulder: Secondary | ICD-10-CM | POA: Diagnosis not present

## 2016-07-29 DIAGNOSIS — M7501 Adhesive capsulitis of right shoulder: Secondary | ICD-10-CM | POA: Diagnosis not present

## 2016-07-29 DIAGNOSIS — Z4789 Encounter for other orthopedic aftercare: Secondary | ICD-10-CM | POA: Diagnosis not present

## 2016-08-02 DIAGNOSIS — M7501 Adhesive capsulitis of right shoulder: Secondary | ICD-10-CM | POA: Diagnosis not present

## 2016-08-02 DIAGNOSIS — M25511 Pain in right shoulder: Secondary | ICD-10-CM | POA: Diagnosis not present

## 2016-08-02 DIAGNOSIS — Z4789 Encounter for other orthopedic aftercare: Secondary | ICD-10-CM | POA: Diagnosis not present

## 2016-08-02 DIAGNOSIS — Z79899 Other long term (current) drug therapy: Secondary | ICD-10-CM | POA: Diagnosis not present

## 2016-08-03 DIAGNOSIS — G4733 Obstructive sleep apnea (adult) (pediatric): Secondary | ICD-10-CM | POA: Diagnosis not present

## 2016-08-03 DIAGNOSIS — J302 Other seasonal allergic rhinitis: Secondary | ICD-10-CM | POA: Diagnosis not present

## 2016-08-03 DIAGNOSIS — M25511 Pain in right shoulder: Secondary | ICD-10-CM | POA: Diagnosis not present

## 2016-08-03 DIAGNOSIS — Z4789 Encounter for other orthopedic aftercare: Secondary | ICD-10-CM | POA: Diagnosis not present

## 2016-08-03 DIAGNOSIS — M7501 Adhesive capsulitis of right shoulder: Secondary | ICD-10-CM | POA: Diagnosis not present

## 2016-08-18 DIAGNOSIS — M25511 Pain in right shoulder: Secondary | ICD-10-CM | POA: Diagnosis not present

## 2016-08-18 DIAGNOSIS — M7501 Adhesive capsulitis of right shoulder: Secondary | ICD-10-CM | POA: Diagnosis not present

## 2016-08-18 DIAGNOSIS — Z4789 Encounter for other orthopedic aftercare: Secondary | ICD-10-CM | POA: Diagnosis not present

## 2016-08-23 DIAGNOSIS — M25511 Pain in right shoulder: Secondary | ICD-10-CM | POA: Diagnosis not present

## 2016-08-23 DIAGNOSIS — M7501 Adhesive capsulitis of right shoulder: Secondary | ICD-10-CM | POA: Diagnosis not present

## 2016-08-23 DIAGNOSIS — Z4789 Encounter for other orthopedic aftercare: Secondary | ICD-10-CM | POA: Diagnosis not present

## 2016-08-25 DIAGNOSIS — Z4789 Encounter for other orthopedic aftercare: Secondary | ICD-10-CM | POA: Diagnosis not present

## 2016-08-25 DIAGNOSIS — M7501 Adhesive capsulitis of right shoulder: Secondary | ICD-10-CM | POA: Diagnosis not present

## 2016-08-25 DIAGNOSIS — M25511 Pain in right shoulder: Secondary | ICD-10-CM | POA: Diagnosis not present

## 2016-08-27 DIAGNOSIS — G4733 Obstructive sleep apnea (adult) (pediatric): Secondary | ICD-10-CM | POA: Diagnosis not present

## 2016-08-27 DIAGNOSIS — M7501 Adhesive capsulitis of right shoulder: Secondary | ICD-10-CM | POA: Diagnosis not present

## 2016-08-27 DIAGNOSIS — M25511 Pain in right shoulder: Secondary | ICD-10-CM | POA: Diagnosis not present

## 2016-08-27 DIAGNOSIS — Z4789 Encounter for other orthopedic aftercare: Secondary | ICD-10-CM | POA: Diagnosis not present

## 2016-08-31 DIAGNOSIS — M7501 Adhesive capsulitis of right shoulder: Secondary | ICD-10-CM | POA: Diagnosis not present

## 2016-08-31 DIAGNOSIS — M25511 Pain in right shoulder: Secondary | ICD-10-CM | POA: Diagnosis not present

## 2016-08-31 DIAGNOSIS — Z4789 Encounter for other orthopedic aftercare: Secondary | ICD-10-CM | POA: Diagnosis not present

## 2016-09-01 DIAGNOSIS — M7501 Adhesive capsulitis of right shoulder: Secondary | ICD-10-CM | POA: Diagnosis not present

## 2016-09-01 DIAGNOSIS — M25511 Pain in right shoulder: Secondary | ICD-10-CM | POA: Diagnosis not present

## 2016-09-01 DIAGNOSIS — Z4789 Encounter for other orthopedic aftercare: Secondary | ICD-10-CM | POA: Diagnosis not present

## 2016-09-03 DIAGNOSIS — Z4789 Encounter for other orthopedic aftercare: Secondary | ICD-10-CM | POA: Diagnosis not present

## 2016-09-03 DIAGNOSIS — M25511 Pain in right shoulder: Secondary | ICD-10-CM | POA: Diagnosis not present

## 2016-09-03 DIAGNOSIS — M7501 Adhesive capsulitis of right shoulder: Secondary | ICD-10-CM | POA: Diagnosis not present

## 2016-09-13 DIAGNOSIS — M7501 Adhesive capsulitis of right shoulder: Secondary | ICD-10-CM | POA: Diagnosis not present

## 2016-09-13 DIAGNOSIS — Z4789 Encounter for other orthopedic aftercare: Secondary | ICD-10-CM | POA: Diagnosis not present

## 2016-09-13 DIAGNOSIS — M25511 Pain in right shoulder: Secondary | ICD-10-CM | POA: Diagnosis not present

## 2016-09-15 DIAGNOSIS — Z4789 Encounter for other orthopedic aftercare: Secondary | ICD-10-CM | POA: Diagnosis not present

## 2016-09-15 DIAGNOSIS — M7501 Adhesive capsulitis of right shoulder: Secondary | ICD-10-CM | POA: Diagnosis not present

## 2016-09-15 DIAGNOSIS — M25511 Pain in right shoulder: Secondary | ICD-10-CM | POA: Diagnosis not present

## 2016-09-20 DIAGNOSIS — Z23 Encounter for immunization: Secondary | ICD-10-CM | POA: Diagnosis not present

## 2016-09-21 DIAGNOSIS — M7501 Adhesive capsulitis of right shoulder: Secondary | ICD-10-CM | POA: Diagnosis not present

## 2016-09-21 DIAGNOSIS — M25511 Pain in right shoulder: Secondary | ICD-10-CM | POA: Diagnosis not present

## 2016-09-21 DIAGNOSIS — Z4789 Encounter for other orthopedic aftercare: Secondary | ICD-10-CM | POA: Diagnosis not present

## 2016-09-22 DIAGNOSIS — M25511 Pain in right shoulder: Secondary | ICD-10-CM | POA: Diagnosis not present

## 2016-09-22 DIAGNOSIS — M7501 Adhesive capsulitis of right shoulder: Secondary | ICD-10-CM | POA: Diagnosis not present

## 2016-09-22 DIAGNOSIS — Z4789 Encounter for other orthopedic aftercare: Secondary | ICD-10-CM | POA: Diagnosis not present

## 2016-09-23 DIAGNOSIS — Z4789 Encounter for other orthopedic aftercare: Secondary | ICD-10-CM | POA: Diagnosis not present

## 2016-09-23 DIAGNOSIS — M25511 Pain in right shoulder: Secondary | ICD-10-CM | POA: Diagnosis not present

## 2016-09-23 DIAGNOSIS — M7501 Adhesive capsulitis of right shoulder: Secondary | ICD-10-CM | POA: Diagnosis not present

## 2016-09-27 DIAGNOSIS — G4733 Obstructive sleep apnea (adult) (pediatric): Secondary | ICD-10-CM | POA: Diagnosis not present

## 2016-09-28 DIAGNOSIS — M7501 Adhesive capsulitis of right shoulder: Secondary | ICD-10-CM | POA: Diagnosis not present

## 2016-09-28 DIAGNOSIS — M25511 Pain in right shoulder: Secondary | ICD-10-CM | POA: Diagnosis not present

## 2016-09-28 DIAGNOSIS — Z4789 Encounter for other orthopedic aftercare: Secondary | ICD-10-CM | POA: Diagnosis not present

## 2016-09-29 DIAGNOSIS — M25511 Pain in right shoulder: Secondary | ICD-10-CM | POA: Diagnosis not present

## 2016-09-29 DIAGNOSIS — M7501 Adhesive capsulitis of right shoulder: Secondary | ICD-10-CM | POA: Diagnosis not present

## 2016-09-29 DIAGNOSIS — Z4789 Encounter for other orthopedic aftercare: Secondary | ICD-10-CM | POA: Diagnosis not present

## 2016-10-01 DIAGNOSIS — M25511 Pain in right shoulder: Secondary | ICD-10-CM | POA: Diagnosis not present

## 2016-10-01 DIAGNOSIS — Z4789 Encounter for other orthopedic aftercare: Secondary | ICD-10-CM | POA: Diagnosis not present

## 2016-10-01 DIAGNOSIS — M7501 Adhesive capsulitis of right shoulder: Secondary | ICD-10-CM | POA: Diagnosis not present

## 2016-10-04 DIAGNOSIS — Z4789 Encounter for other orthopedic aftercare: Secondary | ICD-10-CM | POA: Diagnosis not present

## 2016-10-04 DIAGNOSIS — M7501 Adhesive capsulitis of right shoulder: Secondary | ICD-10-CM | POA: Diagnosis not present

## 2016-10-04 DIAGNOSIS — M25511 Pain in right shoulder: Secondary | ICD-10-CM | POA: Diagnosis not present

## 2016-10-07 DIAGNOSIS — M7501 Adhesive capsulitis of right shoulder: Secondary | ICD-10-CM | POA: Diagnosis not present

## 2016-10-07 DIAGNOSIS — M25511 Pain in right shoulder: Secondary | ICD-10-CM | POA: Diagnosis not present

## 2016-10-07 DIAGNOSIS — Z4789 Encounter for other orthopedic aftercare: Secondary | ICD-10-CM | POA: Diagnosis not present

## 2016-10-14 DIAGNOSIS — M7501 Adhesive capsulitis of right shoulder: Secondary | ICD-10-CM | POA: Diagnosis not present

## 2016-10-14 DIAGNOSIS — Z4789 Encounter for other orthopedic aftercare: Secondary | ICD-10-CM | POA: Diagnosis not present

## 2016-10-14 DIAGNOSIS — M25511 Pain in right shoulder: Secondary | ICD-10-CM | POA: Diagnosis not present

## 2016-10-15 DIAGNOSIS — M25511 Pain in right shoulder: Secondary | ICD-10-CM | POA: Diagnosis not present

## 2016-10-15 DIAGNOSIS — Z4789 Encounter for other orthopedic aftercare: Secondary | ICD-10-CM | POA: Diagnosis not present

## 2016-10-15 DIAGNOSIS — M7501 Adhesive capsulitis of right shoulder: Secondary | ICD-10-CM | POA: Diagnosis not present

## 2016-10-18 DIAGNOSIS — M7501 Adhesive capsulitis of right shoulder: Secondary | ICD-10-CM | POA: Diagnosis not present

## 2016-10-18 DIAGNOSIS — M25511 Pain in right shoulder: Secondary | ICD-10-CM | POA: Diagnosis not present

## 2016-10-18 DIAGNOSIS — Z4789 Encounter for other orthopedic aftercare: Secondary | ICD-10-CM | POA: Diagnosis not present

## 2016-10-20 DIAGNOSIS — M25511 Pain in right shoulder: Secondary | ICD-10-CM | POA: Diagnosis not present

## 2016-10-20 DIAGNOSIS — Z4789 Encounter for other orthopedic aftercare: Secondary | ICD-10-CM | POA: Diagnosis not present

## 2016-10-20 DIAGNOSIS — M7501 Adhesive capsulitis of right shoulder: Secondary | ICD-10-CM | POA: Diagnosis not present

## 2016-10-22 DIAGNOSIS — M7501 Adhesive capsulitis of right shoulder: Secondary | ICD-10-CM | POA: Diagnosis not present

## 2016-10-22 DIAGNOSIS — M25511 Pain in right shoulder: Secondary | ICD-10-CM | POA: Diagnosis not present

## 2016-10-22 DIAGNOSIS — Z4789 Encounter for other orthopedic aftercare: Secondary | ICD-10-CM | POA: Diagnosis not present

## 2016-10-25 DIAGNOSIS — M7501 Adhesive capsulitis of right shoulder: Secondary | ICD-10-CM | POA: Diagnosis not present

## 2016-10-25 DIAGNOSIS — M25511 Pain in right shoulder: Secondary | ICD-10-CM | POA: Diagnosis not present

## 2016-10-25 DIAGNOSIS — Z4789 Encounter for other orthopedic aftercare: Secondary | ICD-10-CM | POA: Diagnosis not present

## 2016-10-27 DIAGNOSIS — Z4789 Encounter for other orthopedic aftercare: Secondary | ICD-10-CM | POA: Diagnosis not present

## 2016-10-27 DIAGNOSIS — G4733 Obstructive sleep apnea (adult) (pediatric): Secondary | ICD-10-CM | POA: Diagnosis not present

## 2016-10-27 DIAGNOSIS — M25511 Pain in right shoulder: Secondary | ICD-10-CM | POA: Diagnosis not present

## 2016-10-27 DIAGNOSIS — M7501 Adhesive capsulitis of right shoulder: Secondary | ICD-10-CM | POA: Diagnosis not present

## 2016-10-29 DIAGNOSIS — M7501 Adhesive capsulitis of right shoulder: Secondary | ICD-10-CM | POA: Diagnosis not present

## 2016-10-29 DIAGNOSIS — Z4789 Encounter for other orthopedic aftercare: Secondary | ICD-10-CM | POA: Diagnosis not present

## 2016-10-29 DIAGNOSIS — M25511 Pain in right shoulder: Secondary | ICD-10-CM | POA: Diagnosis not present

## 2016-11-01 DIAGNOSIS — M7501 Adhesive capsulitis of right shoulder: Secondary | ICD-10-CM | POA: Diagnosis not present

## 2016-11-01 DIAGNOSIS — Z4789 Encounter for other orthopedic aftercare: Secondary | ICD-10-CM | POA: Diagnosis not present

## 2016-11-01 DIAGNOSIS — M25511 Pain in right shoulder: Secondary | ICD-10-CM | POA: Diagnosis not present

## 2016-11-03 DIAGNOSIS — M25511 Pain in right shoulder: Secondary | ICD-10-CM | POA: Diagnosis not present

## 2016-11-03 DIAGNOSIS — Z4789 Encounter for other orthopedic aftercare: Secondary | ICD-10-CM | POA: Diagnosis not present

## 2016-11-03 DIAGNOSIS — Z23 Encounter for immunization: Secondary | ICD-10-CM | POA: Diagnosis not present

## 2016-11-03 DIAGNOSIS — M7501 Adhesive capsulitis of right shoulder: Secondary | ICD-10-CM | POA: Diagnosis not present

## 2016-11-05 DIAGNOSIS — Z4789 Encounter for other orthopedic aftercare: Secondary | ICD-10-CM | POA: Diagnosis not present

## 2016-11-05 DIAGNOSIS — M25511 Pain in right shoulder: Secondary | ICD-10-CM | POA: Diagnosis not present

## 2016-11-05 DIAGNOSIS — M7501 Adhesive capsulitis of right shoulder: Secondary | ICD-10-CM | POA: Diagnosis not present

## 2016-11-08 DIAGNOSIS — M25511 Pain in right shoulder: Secondary | ICD-10-CM | POA: Diagnosis not present

## 2016-11-08 DIAGNOSIS — Z4789 Encounter for other orthopedic aftercare: Secondary | ICD-10-CM | POA: Diagnosis not present

## 2016-11-08 DIAGNOSIS — M7501 Adhesive capsulitis of right shoulder: Secondary | ICD-10-CM | POA: Diagnosis not present

## 2016-11-11 DIAGNOSIS — M25511 Pain in right shoulder: Secondary | ICD-10-CM | POA: Diagnosis not present

## 2016-11-11 DIAGNOSIS — Z4789 Encounter for other orthopedic aftercare: Secondary | ICD-10-CM | POA: Diagnosis not present

## 2016-11-11 DIAGNOSIS — M7501 Adhesive capsulitis of right shoulder: Secondary | ICD-10-CM | POA: Diagnosis not present

## 2016-11-12 DIAGNOSIS — M25511 Pain in right shoulder: Secondary | ICD-10-CM | POA: Diagnosis not present

## 2016-11-12 DIAGNOSIS — Z09 Encounter for follow-up examination after completed treatment for conditions other than malignant neoplasm: Secondary | ICD-10-CM | POA: Diagnosis not present

## 2016-11-27 DIAGNOSIS — G4733 Obstructive sleep apnea (adult) (pediatric): Secondary | ICD-10-CM | POA: Diagnosis not present

## 2016-12-01 DIAGNOSIS — G4733 Obstructive sleep apnea (adult) (pediatric): Secondary | ICD-10-CM | POA: Diagnosis not present

## 2016-12-24 DIAGNOSIS — Z79899 Other long term (current) drug therapy: Secondary | ICD-10-CM | POA: Diagnosis not present

## 2016-12-24 DIAGNOSIS — I10 Essential (primary) hypertension: Secondary | ICD-10-CM | POA: Diagnosis not present

## 2016-12-24 DIAGNOSIS — M7501 Adhesive capsulitis of right shoulder: Secondary | ICD-10-CM | POA: Diagnosis not present

## 2016-12-24 DIAGNOSIS — E119 Type 2 diabetes mellitus without complications: Secondary | ICD-10-CM | POA: Diagnosis not present

## 2016-12-27 DIAGNOSIS — G4733 Obstructive sleep apnea (adult) (pediatric): Secondary | ICD-10-CM | POA: Diagnosis not present

## 2017-01-27 DIAGNOSIS — G4733 Obstructive sleep apnea (adult) (pediatric): Secondary | ICD-10-CM | POA: Diagnosis not present

## 2017-02-04 DIAGNOSIS — M7501 Adhesive capsulitis of right shoulder: Secondary | ICD-10-CM | POA: Diagnosis not present

## 2017-02-10 ENCOUNTER — Encounter: Payer: Self-pay | Admitting: Obstetrics & Gynecology

## 2017-02-10 ENCOUNTER — Other Ambulatory Visit: Payer: Self-pay

## 2017-02-10 ENCOUNTER — Ambulatory Visit: Payer: 59 | Admitting: Obstetrics & Gynecology

## 2017-02-10 VITALS — BP 118/72 | HR 76 | Resp 14 | Ht 65.5 in | Wt 221.0 lb

## 2017-02-10 DIAGNOSIS — I10 Essential (primary) hypertension: Secondary | ICD-10-CM

## 2017-02-10 DIAGNOSIS — Z01419 Encounter for gynecological examination (general) (routine) without abnormal findings: Secondary | ICD-10-CM | POA: Diagnosis not present

## 2017-02-10 DIAGNOSIS — G4733 Obstructive sleep apnea (adult) (pediatric): Secondary | ICD-10-CM | POA: Diagnosis not present

## 2017-02-10 DIAGNOSIS — E785 Hyperlipidemia, unspecified: Secondary | ICD-10-CM | POA: Insufficient documentation

## 2017-02-10 DIAGNOSIS — G4730 Sleep apnea, unspecified: Secondary | ICD-10-CM | POA: Insufficient documentation

## 2017-02-10 NOTE — Progress Notes (Signed)
57 y.o. G0P0 MarriedCaucasianF here for annual exam.  Doing well.  Uses CPAP.  Denies vaginal bleeding.  Has traveled a lot this past year.  Has been on two cruises, to [place] and two long motorcycles rides.    PCP:  Dr. Felipa Eth.  Seen in November.  HbA1C was 5.9.  Cholesterol level was good in May.  Total was 141.    Patient's last menstrual period was 05/30/2006 (approximate).          Sexually active: Yes.    The current method of family planning is status post hysterectomy.    Exercising: No.   Smoker:  no  Health Maintenance: Pap:  2008 Neg History of abnormal Pap:  Yes, 1990s hx cryotherapy MMG: 02-09-16 Density C/Neg/BiRads1 Colonoscopy:  07/20/10 Normal, 10 year follow up.  Dr. Earlean Shawl BMD:   n/a TDaP:  04/2013 Pneumonia vaccine(s):  2016 Shingrix:  2018 (did the 2 doses) Hep C testing: 10-24-15 Neg Screening Labs: PCP, Hb today: not needed   reports that  has never smoked. she has never used smokeless tobacco. She reports that she drinks alcohol. She reports that she does not use drugs.  Past Medical History:  Diagnosis Date  . Abnormal Pap smear of cervix    cryo 1990's  . Asthma   . Diabetes ([place]) 2015   prediabetes-on meds  . Hypercholesteremia 2016  . Hypertension 40's  . OSA on CPAP 09/2008    Past Surgical History:  Procedure Laterality Date  . ABDOMINAL HYSTERECTOMY  11/208   fibroids, ovaries remain  . CARPAL TUNNEL RELEASE Right 2000  . CERVICAL FUSION  10/2007   C4-C5-C6, Dr. Ellene Route  . frozen shoulder release Right 05/2016  . GYNECOLOGIC CRYOSURGERY  1990's   cervix  . LAPAROSCOPIC CHOLECYSTECTOMY  1994  . MENISCUS REPAIR Right 2006   Dr. French Ana  . NASAL SINUS SURGERY  2004   with uvula reduction    Current Outpatient Medications  Medication Sig Dispense Refill  . atorvastatin (LIPITOR) 20 MG tablet Take 1 tablet by mouth daily.    . Cholecalciferol (VITAMIN D3) 2000 units capsule Take 1 capsule by mouth daily.    Marland Kitchen FIBER PO Take by mouth  as needed.     . Glucosamine Sulfate (FP GLUCOSAMINE PO) Take by mouth daily.    Marland Kitchen glucosamine-chondroitin 500-400 MG tablet Take 1 tablet by mouth daily.    . hydrochlorothiazide (MICROZIDE) 12.5 MG capsule Take 1 capsule by mouth daily.    Marland Kitchen levothyroxine (SYNTHROID, LEVOTHROID) 50 MCG tablet TAKE 1 TABLET BY MOUTH  DAILY 90 tablet 4  . lisinopril (PRINIVIL,ZESTRIL) 10 MG tablet Take 1 tablet by mouth daily.    . metFORMIN (GLUCOPHAGE-XR) 500 MG 24 hr tablet 500 mg daily.    . Multiple Vitamin (MULTIVITAMIN) tablet Take 1 tablet by mouth daily.     No current facility-administered medications for this visit.     Family History  Problem Relation Age of Onset  . Hypertension Mother   . Thyroid disease Mother   . Hyperlipidemia Mother   . Diabetes Father   . Hypertension Father   . Heart attack Father   . Hepatitis C Father   . Heart disease Father        open heart surgery 1982  . Hyperlipidemia Father   . Thyroid disease Maternal Grandmother   . Cancer Maternal Grandfather        related to asbestos and complications with throat/ esophageal cancer    ROS:  Pertinent items are noted in HPI.  Otherwise, a comprehensive ROS was negative.  Exam:   BP 118/72 (BP Location: Right Arm, Patient Position: Sitting, Cuff Size: Large)   Pulse 76   Resp 14   Ht 5' 5.5" (1.664 m)   Wt 221 lb (100.2 kg)   LMP 05/30/2006 (Approximate)   BMI 36.22 kg/m   Weight change: +17#   Height: 5' 5.5" (166.4 cm)  Ht Readings from Last 3 Encounters:  02/10/17 5' 5.5" (1.664 m)  10/24/15 5' 5.5" (1.664 m)  07/25/14 5' 5.5" (1.664 m)    General appearance: alert, cooperative and appears stated age Head: Normocephalic, without obvious abnormality, atraumatic Neck: no adenopathy, supple, symmetrical, trachea midline and thyroid normal to inspection and palpation Lungs: clear to auscultation bilaterally Breasts: normal appearance, no masses or tenderness Heart: regular rate and rhythm Abdomen:  soft, non-tender; bowel sounds normal; no masses,  no organomegaly Extremities: extremities normal, atraumatic, no cyanosis or edema Skin: Skin color, texture, turgor normal. No rashes or lesions Lymph nodes: Cervical, supraclavicular, and axillary nodes normal. No abnormal inguinal nodes palpated Neurologic: Grossly normal   Pelvic: External genitalia:  no lesions              Urethra:  normal appearing urethra with no masses, tenderness or lesions              Bartholins and Skenes: normal                 Vagina: normal appearing vagina with normal color and discharge, no lesions              Cervix: absent              Pap taken: No. Bimanual Exam:  Uterus:  uterus absent              Adnexa: no mass, fullness, tenderness               Rectovaginal: Confirms               Anus:  normal sphincter tone, no lesions  Chaperone was present for exam.  A:  Well Woman with normal exam H/O TAH 11/08 Type 2 DM Hypertension Asthma Elevated lipids Hypothyroidism Elevated lipids  P:   Mammogram guidelines reviewed.   pap smear not indicated Lab work and vaccines are UTD Return annually or prn

## 2017-02-16 DIAGNOSIS — R531 Weakness: Secondary | ICD-10-CM | POA: Diagnosis not present

## 2017-02-16 DIAGNOSIS — M7501 Adhesive capsulitis of right shoulder: Secondary | ICD-10-CM | POA: Diagnosis not present

## 2017-02-16 DIAGNOSIS — M25511 Pain in right shoulder: Secondary | ICD-10-CM | POA: Diagnosis not present

## 2017-02-22 DIAGNOSIS — M7501 Adhesive capsulitis of right shoulder: Secondary | ICD-10-CM | POA: Diagnosis not present

## 2017-02-22 DIAGNOSIS — R531 Weakness: Secondary | ICD-10-CM | POA: Diagnosis not present

## 2017-02-22 DIAGNOSIS — M25511 Pain in right shoulder: Secondary | ICD-10-CM | POA: Diagnosis not present

## 2017-02-24 DIAGNOSIS — R531 Weakness: Secondary | ICD-10-CM | POA: Diagnosis not present

## 2017-02-24 DIAGNOSIS — M7501 Adhesive capsulitis of right shoulder: Secondary | ICD-10-CM | POA: Diagnosis not present

## 2017-02-24 DIAGNOSIS — M25511 Pain in right shoulder: Secondary | ICD-10-CM | POA: Diagnosis not present

## 2017-02-27 DIAGNOSIS — G4733 Obstructive sleep apnea (adult) (pediatric): Secondary | ICD-10-CM | POA: Diagnosis not present

## 2017-03-01 DIAGNOSIS — M25511 Pain in right shoulder: Secondary | ICD-10-CM | POA: Diagnosis not present

## 2017-03-01 DIAGNOSIS — R531 Weakness: Secondary | ICD-10-CM | POA: Diagnosis not present

## 2017-03-01 DIAGNOSIS — M7501 Adhesive capsulitis of right shoulder: Secondary | ICD-10-CM | POA: Diagnosis not present

## 2017-03-03 DIAGNOSIS — M7501 Adhesive capsulitis of right shoulder: Secondary | ICD-10-CM | POA: Diagnosis not present

## 2017-03-03 DIAGNOSIS — M25511 Pain in right shoulder: Secondary | ICD-10-CM | POA: Diagnosis not present

## 2017-03-03 DIAGNOSIS — R531 Weakness: Secondary | ICD-10-CM | POA: Diagnosis not present

## 2017-03-04 DIAGNOSIS — D485 Neoplasm of uncertain behavior of skin: Secondary | ICD-10-CM | POA: Diagnosis not present

## 2017-03-04 DIAGNOSIS — D225 Melanocytic nevi of trunk: Secondary | ICD-10-CM | POA: Diagnosis not present

## 2017-03-04 DIAGNOSIS — D2262 Melanocytic nevi of left upper limb, including shoulder: Secondary | ICD-10-CM | POA: Diagnosis not present

## 2017-03-08 DIAGNOSIS — M7501 Adhesive capsulitis of right shoulder: Secondary | ICD-10-CM | POA: Diagnosis not present

## 2017-03-08 DIAGNOSIS — R531 Weakness: Secondary | ICD-10-CM | POA: Diagnosis not present

## 2017-03-08 DIAGNOSIS — M25511 Pain in right shoulder: Secondary | ICD-10-CM | POA: Diagnosis not present

## 2017-03-09 DIAGNOSIS — D485 Neoplasm of uncertain behavior of skin: Secondary | ICD-10-CM | POA: Diagnosis not present

## 2017-03-09 DIAGNOSIS — D2261 Melanocytic nevi of right upper limb, including shoulder: Secondary | ICD-10-CM | POA: Diagnosis not present

## 2017-03-09 DIAGNOSIS — D1801 Hemangioma of skin and subcutaneous tissue: Secondary | ICD-10-CM | POA: Diagnosis not present

## 2017-03-09 DIAGNOSIS — L739 Follicular disorder, unspecified: Secondary | ICD-10-CM | POA: Diagnosis not present

## 2017-03-09 DIAGNOSIS — I781 Nevus, non-neoplastic: Secondary | ICD-10-CM | POA: Diagnosis not present

## 2017-03-09 DIAGNOSIS — D2262 Melanocytic nevi of left upper limb, including shoulder: Secondary | ICD-10-CM | POA: Diagnosis not present

## 2017-03-10 DIAGNOSIS — M25511 Pain in right shoulder: Secondary | ICD-10-CM | POA: Diagnosis not present

## 2017-03-10 DIAGNOSIS — M7501 Adhesive capsulitis of right shoulder: Secondary | ICD-10-CM | POA: Diagnosis not present

## 2017-03-10 DIAGNOSIS — R531 Weakness: Secondary | ICD-10-CM | POA: Diagnosis not present

## 2017-03-16 ENCOUNTER — Other Ambulatory Visit: Payer: Self-pay | Admitting: Obstetrics & Gynecology

## 2017-03-17 DIAGNOSIS — Z719 Counseling, unspecified: Secondary | ICD-10-CM | POA: Diagnosis not present

## 2017-03-17 NOTE — Telephone Encounter (Signed)
Medication refill request: levothyroxine  Last AEX:  02-10-17  Next AEX: 02-13-18  Last MMG (if hormonal medication request): 02-09-16 WNL  Refill authorized: please advise

## 2017-03-18 DIAGNOSIS — D485 Neoplasm of uncertain behavior of skin: Secondary | ICD-10-CM | POA: Diagnosis not present

## 2017-03-18 DIAGNOSIS — L988 Other specified disorders of the skin and subcutaneous tissue: Secondary | ICD-10-CM | POA: Diagnosis not present

## 2017-03-27 DIAGNOSIS — G4733 Obstructive sleep apnea (adult) (pediatric): Secondary | ICD-10-CM | POA: Diagnosis not present

## 2017-04-06 DIAGNOSIS — Z719 Counseling, unspecified: Secondary | ICD-10-CM | POA: Diagnosis not present

## 2017-04-07 ENCOUNTER — Other Ambulatory Visit: Payer: Self-pay | Admitting: Obstetrics & Gynecology

## 2017-04-07 DIAGNOSIS — Z1231 Encounter for screening mammogram for malignant neoplasm of breast: Secondary | ICD-10-CM

## 2017-04-13 DIAGNOSIS — Z719 Counseling, unspecified: Secondary | ICD-10-CM | POA: Diagnosis not present

## 2017-04-21 DIAGNOSIS — Z719 Counseling, unspecified: Secondary | ICD-10-CM | POA: Diagnosis not present

## 2017-04-25 ENCOUNTER — Ambulatory Visit
Admission: RE | Admit: 2017-04-25 | Discharge: 2017-04-25 | Disposition: A | Discharge status: Home / Self Care | Payer: 59 | Source: Ambulatory Visit | Attending: Obstetrics & Gynecology | Admitting: Obstetrics & Gynecology

## 2017-04-25 DIAGNOSIS — Z1231 Encounter for screening mammogram for malignant neoplasm of breast: Secondary | ICD-10-CM

## 2017-05-16 DIAGNOSIS — J019 Acute sinusitis, unspecified: Secondary | ICD-10-CM | POA: Diagnosis not present

## 2017-06-08 DIAGNOSIS — Z0000 Encounter for general adult medical examination without abnormal findings: Secondary | ICD-10-CM | POA: Diagnosis not present

## 2017-06-08 DIAGNOSIS — I10 Essential (primary) hypertension: Secondary | ICD-10-CM | POA: Diagnosis not present

## 2017-06-08 DIAGNOSIS — E1169 Type 2 diabetes mellitus with other specified complication: Secondary | ICD-10-CM | POA: Diagnosis not present

## 2017-06-08 DIAGNOSIS — E7800 Pure hypercholesterolemia, unspecified: Secondary | ICD-10-CM | POA: Diagnosis not present

## 2017-06-08 DIAGNOSIS — Z79899 Other long term (current) drug therapy: Secondary | ICD-10-CM | POA: Diagnosis not present

## 2017-06-28 DIAGNOSIS — H47323 Drusen of optic disc, bilateral: Secondary | ICD-10-CM | POA: Diagnosis not present

## 2017-06-28 DIAGNOSIS — H35033 Hypertensive retinopathy, bilateral: Secondary | ICD-10-CM | POA: Diagnosis not present

## 2017-08-15 DIAGNOSIS — H35362 Drusen (degenerative) of macula, left eye: Secondary | ICD-10-CM | POA: Diagnosis not present

## 2017-08-15 DIAGNOSIS — H40013 Open angle with borderline findings, low risk, bilateral: Secondary | ICD-10-CM | POA: Diagnosis not present

## 2017-11-23 DIAGNOSIS — Z23 Encounter for immunization: Secondary | ICD-10-CM | POA: Diagnosis not present

## 2017-12-12 DIAGNOSIS — I10 Essential (primary) hypertension: Secondary | ICD-10-CM | POA: Diagnosis not present

## 2017-12-12 DIAGNOSIS — E7800 Pure hypercholesterolemia, unspecified: Secondary | ICD-10-CM | POA: Diagnosis not present

## 2017-12-12 DIAGNOSIS — Z79899 Other long term (current) drug therapy: Secondary | ICD-10-CM | POA: Diagnosis not present

## 2017-12-12 DIAGNOSIS — E1169 Type 2 diabetes mellitus with other specified complication: Secondary | ICD-10-CM | POA: Diagnosis not present

## 2018-01-23 DIAGNOSIS — Z719 Counseling, unspecified: Secondary | ICD-10-CM | POA: Diagnosis not present

## 2018-02-13 ENCOUNTER — Ambulatory Visit: Payer: 59 | Admitting: Obstetrics & Gynecology

## 2018-02-13 DIAGNOSIS — I10 Essential (primary) hypertension: Secondary | ICD-10-CM | POA: Diagnosis not present

## 2018-03-03 DIAGNOSIS — D225 Melanocytic nevi of trunk: Secondary | ICD-10-CM | POA: Diagnosis not present

## 2018-03-03 DIAGNOSIS — D2261 Melanocytic nevi of right upper limb, including shoulder: Secondary | ICD-10-CM | POA: Diagnosis not present

## 2018-03-03 DIAGNOSIS — D2262 Melanocytic nevi of left upper limb, including shoulder: Secondary | ICD-10-CM | POA: Diagnosis not present

## 2018-03-03 DIAGNOSIS — L570 Actinic keratosis: Secondary | ICD-10-CM | POA: Diagnosis not present

## 2018-03-24 ENCOUNTER — Other Ambulatory Visit: Payer: Self-pay | Admitting: Obstetrics & Gynecology

## 2018-03-27 NOTE — Telephone Encounter (Signed)
Medication refill request: Levothyroxine 50 mcg   Last AEX:  02/10/17 Next AEX: 07/03/18 Last MMG (if hormonal medication request): 04/26/17 Bi-rads 1 Neg  Refill authorized: Order pended #90 with 2 RF to get to her AEX.

## 2018-04-18 DIAGNOSIS — G4733 Obstructive sleep apnea (adult) (pediatric): Secondary | ICD-10-CM | POA: Diagnosis not present

## 2018-05-26 ENCOUNTER — Other Ambulatory Visit: Payer: Self-pay | Admitting: Obstetrics & Gynecology

## 2018-05-26 DIAGNOSIS — Z1231 Encounter for screening mammogram for malignant neoplasm of breast: Secondary | ICD-10-CM

## 2018-06-05 ENCOUNTER — Ambulatory Visit
Admission: RE | Admit: 2018-06-05 | Discharge: 2018-06-05 | Disposition: A | Discharge status: Home / Self Care | Payer: 59 | Source: Ambulatory Visit | Attending: Obstetrics & Gynecology | Admitting: Obstetrics & Gynecology

## 2018-06-05 ENCOUNTER — Other Ambulatory Visit: Payer: Self-pay

## 2018-06-05 DIAGNOSIS — Z1231 Encounter for screening mammogram for malignant neoplasm of breast: Secondary | ICD-10-CM

## 2018-07-03 ENCOUNTER — Encounter: Payer: Self-pay | Admitting: Obstetrics & Gynecology

## 2018-07-03 ENCOUNTER — Ambulatory Visit (INDEPENDENT_AMBULATORY_CARE_PROVIDER_SITE_OTHER): Payer: 59 | Admitting: Obstetrics & Gynecology

## 2018-07-03 ENCOUNTER — Other Ambulatory Visit: Payer: Self-pay

## 2018-07-03 VITALS — BP 116/70 | HR 80 | Temp 97.5°F | Ht 65.75 in | Wt 240.0 lb

## 2018-07-03 DIAGNOSIS — Z01419 Encounter for gynecological examination (general) (routine) without abnormal findings: Secondary | ICD-10-CM

## 2018-07-03 NOTE — Progress Notes (Signed)
58 y.o. G0P0 Married White or Caucasian female here for annual exam.  Doing well.  Denies vaginal bleeding.    ENT:  Dr. Wilburn Cornelia.  Manages CPAP.    PCP:  Dr. Felipa Eth.  Last appt was late May.  Lab work was done.  HBa1C was 6.3.  Cholesterol was 195.  Triglycerides were 267.  TSH was 2.3.    Patient's last menstrual period was 05/30/2006 (approximate).          Sexually active: Yes.    The current method of family planning is status post hysterectomy.    Exercising: Yes.    walk Smoker:  no  Health Maintenance: Pap:  2008 neg  History of abnormal Pap:  Yes, Cryo 1990's  MMG:  06/05/18 BIRADS1:neg Colonoscopy: 07/20/10, Dr. Earlean Shawl.  This was negative.   BMD:   Never  TDaP:  2015 Pneumonia vaccine(s):  2016 Shingrix:  Completed  Hep C testing: 10/24/15 neg  Screening Labs: PCP    reports that she has never smoked. She has never used smokeless tobacco. She reports current alcohol use of about 4.0 standard drinks of alcohol per week. She reports that she does not use drugs.  Past Medical History:  Diagnosis Date  . Abnormal Pap smear of cervix    cryo 1990's  . Asthma   . Diabetes ([place]) 2015   prediabetes-on meds  . Hypercholesteremia 2016  . Hypertension 40's  . OSA on CPAP 09/2008    Past Surgical History:  Procedure Laterality Date  . ABDOMINAL HYSTERECTOMY  11/208   fibroids, ovaries remain  . CARPAL TUNNEL RELEASE Right 2000  . CERVICAL FUSION  10/2007   C4-C5-C6, Dr. Ellene Route  . frozen shoulder release Right 05/2016  . GYNECOLOGIC CRYOSURGERY  1990's   cervix  . LAPAROSCOPIC CHOLECYSTECTOMY  1994  . MENISCUS REPAIR Right 2006   Dr. French Ana  . NASAL SINUS SURGERY  2004   with uvula reduction    Current Outpatient Medications  Medication Sig Dispense Refill  . atorvastatin (LIPITOR) 20 MG tablet Take 1 tablet by mouth daily.    Marland Kitchen FIBER PO Take by mouth as needed.     . irbesartan-hydrochlorothiazide (AVALIDE) 150-12.5 MG tablet Take 1 tablet by mouth daily.     Marland Kitchen levocetirizine (XYZAL ALLERGY 24HR) 5 MG tablet     . levothyroxine (SYNTHROID, LEVOTHROID) 50 MCG tablet TAKE 1 TABLET BY MOUTH  DAILY 90 tablet 1  . metFORMIN (GLUCOPHAGE-XR) 500 MG 24 hr tablet 500 mg daily.     No current facility-administered medications for this visit.     Family History  Problem Relation Age of Onset  . Hypertension Mother   . Thyroid disease Mother   . Hyperlipidemia Mother   . Diabetes Father   . Hypertension Father   . Heart attack Father   . Hepatitis C Father   . Heart disease Father        open heart surgery 1982  . Hyperlipidemia Father   . Thyroid disease Maternal Grandmother   . Cancer Maternal Grandfather        related to asbestos and complications with throat/ esophageal cancer  . Breast cancer Neg Hx     Review of Systems  All other systems reviewed and are negative.   Exam:   BP 116/70   Pulse 80   Temp (!) 97.5 F (36.4 C) (Temporal)   Ht 5' 5.75" (1.67 m)   Wt 240 lb (108.9 kg)   LMP 05/30/2006 (Approximate)  BMI 39.03 kg/m   Height: 5' 5.75" (167 cm)  Ht Readings from Last 3 Encounters:  07/03/18 5' 5.75" (1.67 m)  02/10/17 5' 5.5" (1.664 m)  10/24/15 5' 5.5" (1.664 m)    General appearance: alert, cooperative and appears stated age Head: Normocephalic, without obvious abnormality, atraumatic Neck: no adenopathy, supple, symmetrical, trachea midline and thyroid normal to inspection and palpation Lungs: clear to auscultation bilaterally Breasts: normal appearance, no masses or tenderness Heart: regular rate and rhythm Abdomen: soft, non-tender; bowel sounds normal; no masses,  no organomegaly Extremities: extremities normal, atraumatic, no cyanosis or edema Skin: Skin color, texture, turgor normal. No rashes or lesions Lymph nodes: Cervical, supraclavicular, and axillary nodes normal. No abnormal inguinal nodes palpated Neurologic: Grossly normal   Pelvic: External genitalia:  no lesions              Urethra:   normal appearing urethra with no masses, tenderness or lesions              Bartholins and Skenes: normal                 Vagina: normal appearing vagina with normal color and discharge, no lesions              Cervix: absent              Pap taken: No. Bimanual Exam:  Uterus:  uterus absent              Adnexa: no mass, fullness, tenderness               Rectovaginal: Confirms               Anus:  normal sphincter tone, no lesions  Chaperone was present for exam.  A:  Well Woman with normal exam PMP, no HRT H/O TAH 11/08 Type 2 DM Hypertension Asthma Hyperlipidemia Hypothyroidism Sleep apnea  P:   Mammogram guidelines reviewed.  This is UTD. pap smear not indicated Lab work just done with Dr. Felipa Eth Colonoscopy due 2022 BMD will be planned at age 26 Return annually or prn

## 2018-09-10 ENCOUNTER — Other Ambulatory Visit: Payer: Self-pay | Admitting: Obstetrics & Gynecology

## 2018-09-11 NOTE — Telephone Encounter (Signed)
Medication refill request: Levothyroxine Last AEX:  07/03/18 SM Next AEX: 08/03/19 Last MMG (if hormonal medication request): 06/05/18 BIRADS 1 negative/density c Refill authorized: Please advise on refill if needed; Order pended #90 w/3 refills if authorized

## 2018-09-11 NOTE — Telephone Encounter (Signed)
I am not typically doing her lab work.  Dr. Felipa Eth is doing it.  I did a refill for this and am happy to keep doing the prescription but I do need her most recent lab work done with Dr. Felipa Eth.  Can she get copies of this for me to review?  I just want to make sure her thyroid is being tested.  Also, as he is doing the blood work, maybe she should transition this refill to him at some point.  Not urgent, of course.  Just a thought.  Thanks.

## 2018-09-12 NOTE — Telephone Encounter (Signed)
Tried calling patient with message as seen below. No answer, left message for patient to call me back at [phone].

## 2018-09-13 ENCOUNTER — Telehealth: Payer: Self-pay

## 2018-09-13 DIAGNOSIS — R2230 Localized swelling, mass and lump, unspecified upper limb: Secondary | ICD-10-CM | POA: Insufficient documentation

## 2018-09-13 NOTE — Telephone Encounter (Signed)
Spoke with patient regarding 09/10/18 refill request. Patient states that she had labs in May results are being faxed to [place] from Dr. Carlyle Lipa office. Att Geraldy Akridge.

## 2018-09-14 MED ORDER — LEVOTHYROXINE SODIUM 50 MCG PO TABS: 50.0000 ug | ORAL_TABLET | Freq: Every day | ORAL | 3 refills | Status: DC

## 2018-09-14 NOTE — Telephone Encounter (Signed)
Outside labs reviewed and rx completed for levothyroxine for the year.  Ok to close encounter.

## 2018-09-15 ENCOUNTER — Other Ambulatory Visit: Payer: Self-pay | Admitting: Orthopedic Surgery

## 2018-09-15 DIAGNOSIS — R2232 Localized swelling, mass and lump, left upper limb: Secondary | ICD-10-CM

## 2018-09-15 NOTE — Telephone Encounter (Signed)
2nd attempt to reach patient, no answer. Left message to call me back at [phone].

## 2018-09-18 ENCOUNTER — Telehealth: Payer: Self-pay | Admitting: Obstetrics & Gynecology

## 2018-09-18 NOTE — Telephone Encounter (Signed)
Spoke with patient regarding encounter 09/10/18. Labs have been received and reviewed by Dr. Sabra Heck. Patient has been informed that prescription sent in for a year supply. Closing encounter.

## 2018-09-18 NOTE — Telephone Encounter (Signed)
Patient is returning call to [facility]. See prior telephone encounters.

## 2018-09-22 ENCOUNTER — Ambulatory Visit
Admission: RE | Admit: 2018-09-22 | Discharge: 2018-09-22 | Disposition: A | Discharge status: Home / Self Care | Payer: 59 | Source: Ambulatory Visit | Attending: Orthopedic Surgery | Admitting: Orthopedic Surgery

## 2018-09-22 DIAGNOSIS — R2232 Localized swelling, mass and lump, left upper limb: Secondary | ICD-10-CM

## 2018-11-23 ENCOUNTER — Other Ambulatory Visit: Payer: Self-pay

## 2018-11-23 ENCOUNTER — Ambulatory Visit: Payer: 59 | Attending: Geriatric Medicine

## 2018-11-23 DIAGNOSIS — H8111 Benign paroxysmal vertigo, right ear: Secondary | ICD-10-CM | POA: Diagnosis present

## 2018-11-23 DIAGNOSIS — R42 Dizziness and giddiness: Secondary | ICD-10-CM | POA: Insufficient documentation

## 2018-11-23 DIAGNOSIS — H8112 Benign paroxysmal vertigo, left ear: Secondary | ICD-10-CM | POA: Diagnosis present

## 2018-11-23 DIAGNOSIS — R2689 Other abnormalities of gait and mobility: Secondary | ICD-10-CM | POA: Diagnosis present

## 2018-11-23 NOTE — Therapy (Signed)
[facility] [address] [place], [place], [address] Phone: [phone]   Fax:  [phone]  Physical Therapy Evaluation  Patient Details  Name: [name] MRN: [id number] Date of Birth: 1960/09/17 Referring Provider (PT): Dr. Felipa Eth   Encounter Date: 11/23/2018  PT End of Session - 11/23/18 1326    Visit Number  1    Number of Visits  4    Date for PT Re-Evaluation  12/23/18    Authorization Type  [employer]-60 visit combined PT, OT and speech. Zero used prior to today's eval.    Authorization - Visit Number  1    Authorization - Number of Visits  60    PT Start Time  8757    PT Stop Time  1232    PT Time Calculation (min)  40 min    Equipment Utilized During Treatment  Other (comment)   min guard to S for safety.   Activity Tolerance  Patient tolerated treatment well    Behavior During Therapy  [facility] for tasks assessed/performed       Past Medical History:  Diagnosis Date  . Abnormal Pap smear of cervix    cryo 1990's  . Asthma   . Diabetes ([place]) 2015   prediabetes-on meds  . Hypercholesteremia 2016  . Hypertension 40's  . OSA on CPAP 09/2008    Past Surgical History:  Procedure Laterality Date  . ABDOMINAL HYSTERECTOMY  11/208   fibroids, ovaries remain  . CARPAL TUNNEL RELEASE Right 2000  . CERVICAL FUSION  10/2007   C4-C5-C6, Dr. Ellene Route  . frozen shoulder release Right 05/2016  . GYNECOLOGIC CRYOSURGERY  1990's   cervix  . LAPAROSCOPIC CHOLECYSTECTOMY  1994  . MENISCUS REPAIR Right 2006   Dr. French Ana  . NASAL SINUS SURGERY  2004   with uvula reduction    There were no vitals filed for this visit.   Subjective Assessment - 11/23/18 1157    Subjective  Pt reported hx of vertigo, which was treated with Epley's maneuver a few years ago. Pt denied LOH, N/T, tinnitus, weakness, or HA. Pt reported rolling to the L side in the bed, lying down, looking up, sitting upright all incr. dizziness.  Dizziness is about the same since onset (weeks, not sure of exact date). At worst: 6/10, at best: 0/10, current: 0/10. Pt denied falls or accidents prior to onset of dizziness.    Pertinent History  HTN, HLD, OSA with CPAP, DM, asthma, hypothyroidism    Patient Stated Goals  To get rid of the dizziness, no spinning.    Currently in Pain?  No/denies         [facility] PT Assessment - 11/23/18 1200      Assessment   Medical Diagnosis  BPPV    Referring Provider (PT)  Dr. Felipa Eth    Onset Date/Surgical Date  10/24/18    Hand Dominance  Right    Prior Therapy  Epley treatment by audiologist      Precautions   Precautions  None      Restrictions   Weight Bearing Restrictions  No      Balance Screen   Has the patient fallen in the past 6 months  No    Has the patient had a decrease in activity level because of a fear of falling?   No      Home Film/video editor residence    Living Arrangements  Spouse/significant other  Available Help at Discharge  Family;Available PRN/intermittently    Type of Home  House    Home Access  Stairs to enter    Entrance Stairs-Number of Steps  10    Entrance Stairs-Rails  Can reach both    Home Layout  Multi-level;Able to live on main level with bedroom/bathroom    Alternate Level Stairs-Number of Steps  15    Alternate Level Stairs-Rails  Left   upstairs   Home Equipment  None      Prior Function   Level of Independence  Independent    Vocation  Retired    Psychologist, educational, travel      Cognition   Overall Cognitive Status  Within Functional Limits for tasks assessed      Ambulation/Gait   Ambulation/Gait  Yes    Ambulation/Gait Assistance  7: Independent    Ambulation Distance (Feet)  200 Feet    Assistive device  None    Gait Pattern  Within Functional Limits;Step-through pattern    Ambulation Surface  Level;Indoor    Gait velocity  3.63f/sec.            Vestibular Assessment - 11/23/18 1205       Symptom Behavior   Subjective history of current problem  see assessment    Type of Dizziness   Spinning    Frequency of Dizziness  Daily    Duration of Dizziness  10-15 seconds at best and 20 sec. at worst.    Symptom Nature  Positional    Aggravating Factors  Lying supine;Rolling to left;Supine to sit;Looking up to the ceiling    Relieving Factors  Rest;Slow movements    Progression of Symptoms  No change since onset    History of similar episodes  Yes      Oculomotor Exam   Oculomotor Alignment  Normal    Spontaneous  Absent    Gaze-induced   Absent    Smooth Pursuits  Intact    Saccades  Intact    Comment  Pt reported wooziness during L sided saccades but not spinning.       Vestibulo-Ocular Reflex   VOR 1 Head Only (x 1 viewing)  Pt denied dizziness.    VOR Cancellation  Normal      Positional Testing   Dix-Hallpike  Dix-Hallpike Right;Dix-Hallpike Left    Horizontal Canal Testing  Horizontal Canal Right;Horizontal Canal Left      Dix-Hallpike Right   Dix-Hallpike Right Duration  30 sec.    Dix-Hallpike Right Symptoms  Upbeat, right rotatory nystagmus      Dix-Hallpike Left   Dix-Hallpike Left Duration  15 sec.    Dix-Hallpike Left Symptoms  Upbeat, left rotatory nystagmus      Horizontal Canal Right   Horizontal Canal Right Duration  0    Horizontal Canal Right Symptoms  Normal      Horizontal Canal Left   Horizontal Canal Left Duration  0    Horizontal Canal Left Symptoms  Normal          Objective measurements completed on examination: See above findings.       Vestibular Treatment/Exercise - 11/23/18 1214      Vestibular Treatment/Exercise   Vestibular Treatment Provided  Canalith Repositioning    Canalith Repositioning  Epley Manuever Left;Canal Roll Left       EPLEY MANUEVER LEFT   Number of Reps   2    Overall Response   Symptoms Resolved     RESPONSE  DETAILS LEFT  Pt lifted head during first and second position, and likely converted to  horizontal canal and therefore experienced incr. dizziness during re-assessment after first Epley manuever. PT then performed L BBQ, then second Epley with pt reported dizziness resolved and no nystagmus noted.      Canal Roll Left   Number of Reps   1    Overall Response   Improved Symptoms    Response Details   Apogeotropic nystagmus noted during assessment after first Epley with incr. in dizziness. L BBQ roll performed (as pt had R apogeotropic nystagmus as well), 3/10 dizziness after L BBQ roll. No s/s after L BBQ and second Epley treatment.             PT Education - 11/23/18 1325    Education Details  PT discussed exam findings, outcome measure results, and PT POC, frequency and duration. PT discussed BPPV and likelihood of posterior and horizontal canal BPPV. PT educated pt that she may feel woozy for 24-48 hours after treatment.    Person(s) Educated  Patient    Methods  Explanation;Other (comment)   computer images of inner ear canals   Comprehension  Verbalized understanding       PT Short Term Goals - 11/23/18 1345      PT SHORT TERM GOAL #1   Title  same as LTGs        PT Long Term Goals - 11/23/18 1345      PT LONG TERM GOAL #1   Title  Pt will report 0/10 dizziness during all positional testing to decr. dizziness and improve safety. TARGET DATE FOR ALL LTGS: 12/21/18    Time  4    Period  Weeks    Status  New      PT LONG TERM GOAL #2   Title  Pt will report 0/10 dizziness during all bed mobility and txfs in order to improve QOL and safety.    Time  4    Period  Weeks    Status  New      PT LONG TERM GOAL #3   Title  Perform FGA and write goal as indicated.    Time  4    Period  Weeks    Status  New      PT LONG TERM GOAL #4   Title  Establish HEP as indicated.    Time  4    Period  Weeks    Status  New             Plan - 11/23/18 1336    Clinical Impression Statement  Pt is a pleasant 58y/o female presenting to OPPT neuro for BPPV. Pt's  PMH is significant for the following: HTN, HLD, OSA with CPAP, DM, asthma, hypothyroidism. The following impairments were noted upon exam: dizziness and impaired balance during dizziness. Pt was IND during all mobility and gait speed indicated she is able to safely amb. in the community and is not at risk for falls. Pt experienced concordant dizziness during R and L Dix-Hallpike testing, L>R side, which is consistent with B canalithiasis pBBPV. However, during L Epley maneuver pt lifted head between first and second positions. Upon reassessment for L pBPPV (L Dix-Hallpike) pt experienced apogeotropic nystagmus and incr. dizziness, pt also experienced apogeotropic nystagmus during R sided testing. Therefore, PT trialed L BBQ roll with pt reporting decr. in s/s after BBQ roll. PT then performed L Dix-Hallpike again and reassessed B posterior and horizonal canals  with no nystagmus noted and pt reported 0/10 dizziness. Pt would benefit from skilled PT to improve dizziness during functional mobility.    Personal Factors and Comorbidities  Comorbidity 3+    Examination-Activity Limitations  Bed Mobility;Stand;Transfers    Stability/Clinical Decision Making  Evolving/Moderate complexity    Clinical Decision Making  Moderate    Rehab Potential  Good    PT Frequency  1x / week    PT Duration  4 weeks    PT Treatment/Interventions  Neuromuscular re-education;Gait training;Balance training;Therapeutic exercise;Therapeutic activities;Canalith Repostioning;ADLs/Self Care Home Management;Functional mobility training;Patient/family education    PT Next Visit Plan  Reassess for BPPV in all canals and treat as indicated. Formally assess balance as indicated-HEP as needed.    Consulted and Agree with Plan of Care  Patient       Patient will benefit from skilled therapeutic intervention in order to improve the following deficits and impairments:  Dizziness, Decreased balance, Decreased mobility, Decreased activity  tolerance  Visit Diagnosis: BPPV (benign paroxysmal positional vertigo), left - Plan: PT plan of care cert/re-cert  BPPV (benign paroxysmal positional vertigo), right - Plan: PT plan of care cert/re-cert  Dizziness and giddiness - Plan: PT plan of care cert/re-cert  Other abnormalities of gait and mobility - Plan: PT plan of care cert/re-cert     Problem List Patient Active Problem List   Diagnosis Date Noted  . Sleep apnea 02/10/2017  . Elevated lipids 02/10/2017  . Hypertension 02/10/2017    Denasia Venn L 11/23/2018, 1:49 PM  [facility] [address] [place], [place], [address] Phone: [phone]   Fax:  [phone]  Name: [name] MRN: [id number] Date of Birth: 08/20/1960  Geoffry Paradise, PT,DPT 11/23/18 1:49 PM Phone: [phone] Fax: [phone]

## 2018-11-28 ENCOUNTER — Other Ambulatory Visit: Payer: Self-pay

## 2018-11-28 ENCOUNTER — Ambulatory Visit: Payer: 59 | Attending: Geriatric Medicine | Admitting: Physical Therapy

## 2018-11-28 VITALS — BP 128/84

## 2018-11-28 DIAGNOSIS — R42 Dizziness and giddiness: Secondary | ICD-10-CM

## 2018-11-28 DIAGNOSIS — H8111 Benign paroxysmal vertigo, right ear: Secondary | ICD-10-CM

## 2018-11-28 NOTE — Patient Instructions (Signed)
Sit to Side-Lying    Sit on edge of bed. 1. Turn head 45 to right. 2. Maintain head position and lie down slowly on left side. Hold until symptoms subside. 3. Sit up slowly. Hold until symptoms subside. 4. Turn head 45 to left. 5. Maintain head position and lie down slowly on right side. Hold until symptoms subside. 6. Sit up slowly. Repeat sequence __5__ times per session. Do __3_ sessions per day.

## 2018-11-29 ENCOUNTER — Encounter: Payer: 59 | Admitting: Physical Therapy

## 2018-11-29 ENCOUNTER — Encounter: Payer: Self-pay | Admitting: Physical Therapy

## 2018-11-29 NOTE — Therapy (Signed)
[facility] [address] [place], [place], [address] Phone: [phone]   Fax:  [phone]  Physical Therapy Treatment  Patient Details  Name: ITALIA BROWNBACK MRN: [id number] Date of Birth: Jan 27, 1960 Referring Provider (PT): Dr. Felipa Eth   Encounter Date: 11/28/2018  PT End of Session - 11/29/18 2050    Visit Number  2    Number of Visits  4    Date for PT Re-Evaluation  12/23/18    Authorization Type  UHC-60 visit combined PT, OT and speech. Zero used prior to today's eval.    Authorization - Visit Number  2    Authorization - Number of Visits  60    PT Start Time  1701    PT Stop Time  1740    PT Time Calculation (min)  39 min    Equipment Utilized During Treatment  Other (comment)   min guard to S for safety.   Activity Tolerance  Patient tolerated treatment well    Behavior During Therapy  [facility] for tasks assessed/performed       Past Medical History:  Diagnosis Date  . Abnormal Pap smear of cervix    cryo 1990's  . Asthma   . Diabetes ([place]) 2015   prediabetes-on meds  . Hypercholesteremia 2016  . Hypertension 40's  . OSA on CPAP 09/2008    Past Surgical History:  Procedure Laterality Date  . ABDOMINAL HYSTERECTOMY  11/208   fibroids, ovaries remain  . CARPAL TUNNEL RELEASE Right 2000  . CERVICAL FUSION  10/2007   C4-C5-C6, Dr. Ellene Route  . frozen shoulder release Right 05/2016  . GYNECOLOGIC CRYOSURGERY  1990's   cervix  . LAPAROSCOPIC CHOLECYSTECTOMY  1994  . MENISCUS REPAIR Right 2006   Dr. French Ana  . NASAL SINUS SURGERY  2004   with uvula reduction    Vitals:   11/28/18 1718  BP: 128/84    Subjective Assessment - 11/28/18 1703    Subjective  Pt states she had vertigo Saturday am upon awakening - was nauseous until about 2 pm; the vertigo episodes have been mild when she lies down and overall much improved since that experienced at time of initial eval on 11-23-18    Pertinent History   HTN, HLD, OSA with CPAP, DM, asthma, hypothyroidism    Patient Stated Goals  To get rid of the dizziness, no spinning.    Currently in Pain?  No/denies                        Vestibular Treatment/Exercise - 11/29/18 0001      Vestibular Treatment/Exercise   Vestibular Treatment Provided  Canalith Repositioning;Habituation    Canalith Repositioning  Epley Manuever Right    Habituation Exercises  Laruth Bouchard Daroff       EPLEY MANUEVER RIGHT   Number of Reps   2    Overall Response  Improved Symptoms    Response Details   No nystagmus noted on either rep - performed Epley based on pt's symptoms      Nestor Lewandowsky   Number of Reps   5    Symptom Description   improved symptoms - no nystagmus noted on any rep       Lt and Rt horizontal roll test (-) with no nystagmus and no c/o vertigo  Rt and Lt sidelying tests - no nystagmus and no c/o vertigo in sidelying position; pt did c/o dizziness with return to upright  with > c/o dizziness from Rt sidelying than from Lt sidelying position  Epley maneuver for Rt BPPV performed 2 reps based on pt's symptoms  Balance assessment not performed due to time constraint and pt reports no balance problems when she is not having vertigo   PT Education - 11/29/18 2049    Education Details  Pt instructed in [place] for habituation prn - only mild symptoms provoked in PT session on 11-28-18    Person(s) Educated  Patient    Methods  Explanation;Demonstration;Handout    Comprehension  Verbalized understanding;Returned demonstration       PT Short Term Goals - 11/23/18 1345      PT SHORT TERM GOAL #1   Title  same as LTGs        PT Long Term Goals - 11/29/18 2054      PT LONG TERM GOAL #1   Title  Pt will report 0/10 dizziness during all positional testing to decr. dizziness and improve safety. TARGET DATE FOR ALL LTGS: 12/21/18    Time  4    Period  Weeks    Status  New      PT LONG TERM GOAL #2   Title  Pt will  report 0/10 dizziness during all bed mobility and txfs in order to improve QOL and safety.    Time  4    Period  Weeks    Status  New      PT LONG TERM GOAL #3   Title  Perform FGA and write goal as indicated.    Time  4    Period  Weeks    Status  New      PT LONG TERM GOAL #4   Title  Establish HEP as indicated.    Time  4    Period  Weeks    Status  New            Plan - 11/29/18 2050    Clinical Impression Statement  Pt reported very minimal vertigo in today's session, mostly with Rt sidelying to sitting position. No nystagmus noted in Rt or Lt Dix-Hallpike position and pt had (-) Lt and Rt horizontal roll test.  Pt instructed in [place] exercises for complete resolution of mild vertigo.  Plan D/C next session if vertigo remains resolved.    Personal Factors and Comorbidities  Comorbidity 3+    Examination-Activity Limitations  Bed Mobility;Stand;Transfers    Stability/Clinical Decision Making  Evolving/Moderate complexity    Rehab Potential  Good    PT Frequency  1x / week    PT Duration  4 weeks    PT Treatment/Interventions  Neuromuscular re-education;Gait training;Balance training;Therapeutic exercise;Therapeutic activities;Canalith Repostioning;ADLs/Self Care Home Management;Functional mobility training;Patient/family education    PT Next Visit Plan  Reassess for BPPV prn - D/C next session    PT [place] given on 11-28-18    Consulted and Agree with Plan of Care  Patient       Patient will benefit from skilled therapeutic intervention in order to improve the following deficits and impairments:  Dizziness, Decreased balance, Decreased mobility, Decreased activity tolerance  Visit Diagnosis: BPPV (benign paroxysmal positional vertigo), right  Dizziness and giddiness     Problem List Patient Active Problem List   Diagnosis Date Noted  . Sleep apnea 02/10/2017  . Elevated lipids 02/10/2017  . Hypertension 02/10/2017     DildayJenness Corner, PT 11/29/2018, 8:56 PM  [facility]  [place], [place], [address] Phone: [phone]   Fax:  [phone]  Name: JASANI AKRIDGE MRN: [id number] Date of Birth: Mar 19, 1960

## 2018-12-01 ENCOUNTER — Other Ambulatory Visit: Payer: Self-pay | Admitting: Obstetrics & Gynecology

## 2018-12-04 ENCOUNTER — Encounter: Payer: Self-pay | Admitting: Obstetrics & Gynecology

## 2018-12-05 ENCOUNTER — Other Ambulatory Visit: Payer: Self-pay | Admitting: *Deleted

## 2018-12-05 MED ORDER — LEVOTHYROXINE SODIUM 50 MCG PO TABS: 50.0000 ug | ORAL_TABLET | Freq: Every day | ORAL | 3 refills | Status: DC

## 2018-12-05 NOTE — Telephone Encounter (Signed)
[name], [name] [facility] Clinical [facility]  Phone Number: [phone]        Was notified by [facility] that the request for a refill of thyroid meds was denied  Do I need to come in for blood work?  Thanks

## 2018-12-05 NOTE — Telephone Encounter (Signed)
Call to patient. Patient advised year supply of Synthroid sent to [facility] in [place] on 09-14-2018. Patient states she needs prescription to go to [facility]. Has enough medication until she receives medication from [facility].   Medication pended for review and signature by Dr. Sabra Heck.

## 2018-12-05 NOTE — Telephone Encounter (Signed)
Spoke with Dominica at [facility], prescription for Synthroid cancelled.

## 2018-12-05 NOTE — Telephone Encounter (Signed)
Message left to return call to [facility] at [phone].   Year supply of Synthroid sent 09-14-2018 to [facility] in [place]. Patient to advise on where prescription needs to be sent.

## 2018-12-06 ENCOUNTER — Ambulatory Visit: Payer: 59

## 2019-05-01 ENCOUNTER — Other Ambulatory Visit: Payer: Self-pay | Admitting: Geriatric Medicine

## 2019-05-01 DIAGNOSIS — Z1231 Encounter for screening mammogram for malignant neoplasm of breast: Secondary | ICD-10-CM

## 2019-05-16 IMAGING — MG DIGITAL SCREENING BILATERAL MAMMOGRAM WITH TOMO AND CAD
8 series · 8 of 24 positions shown · non-contrast
Comparison: Previous exam(s).

CLINICAL DATA: Screening.

EXAM:
DIGITAL SCREENING BILATERAL MAMMOGRAM WITH TOMO AND CAD

[R CC synth-2D]
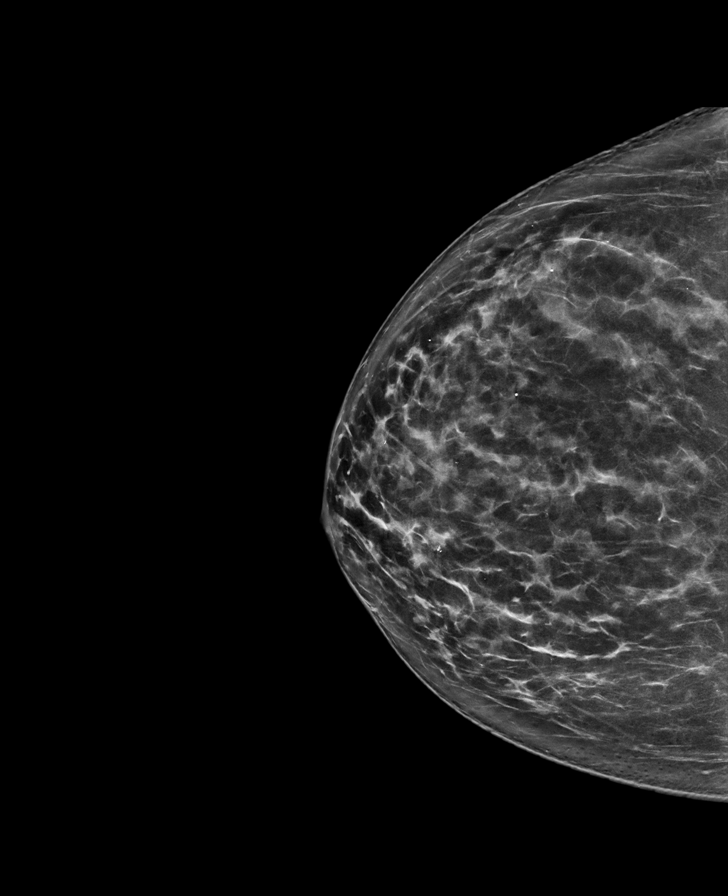

[R MLO synth-2D]
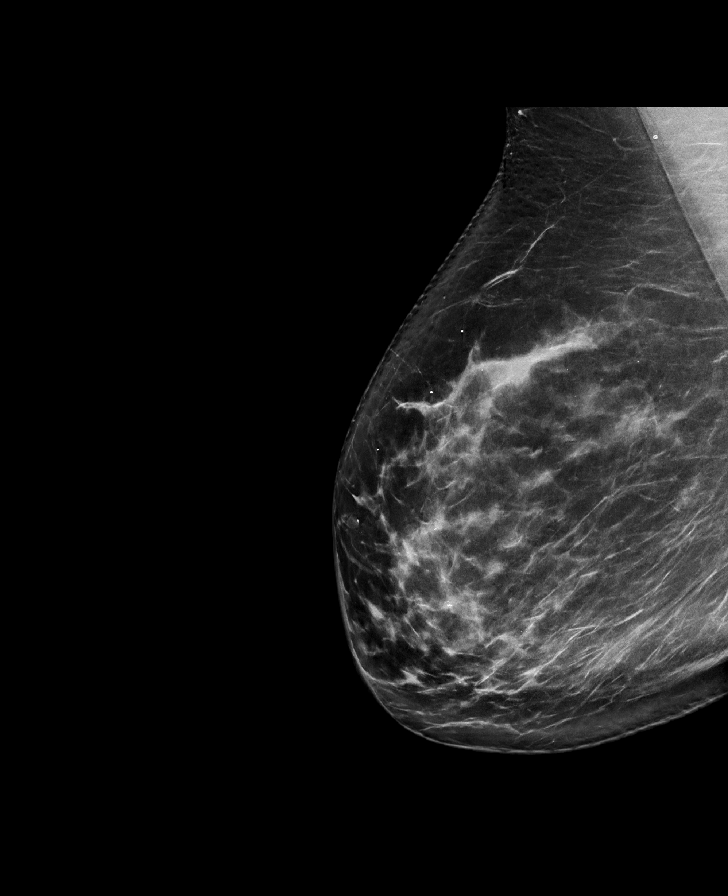

[L CC synth-2D]
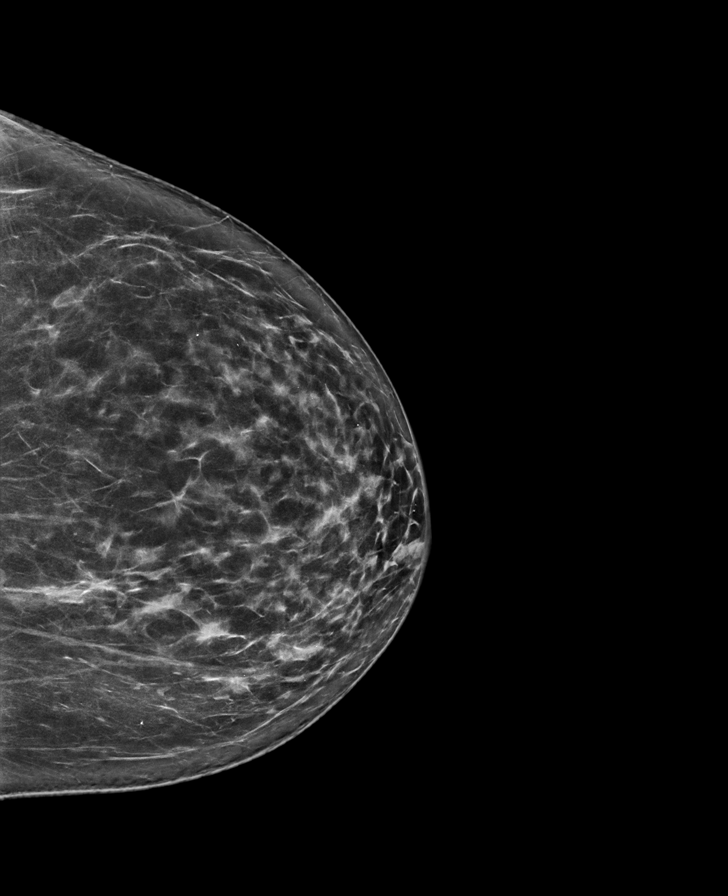

[L MLO synth-2D]
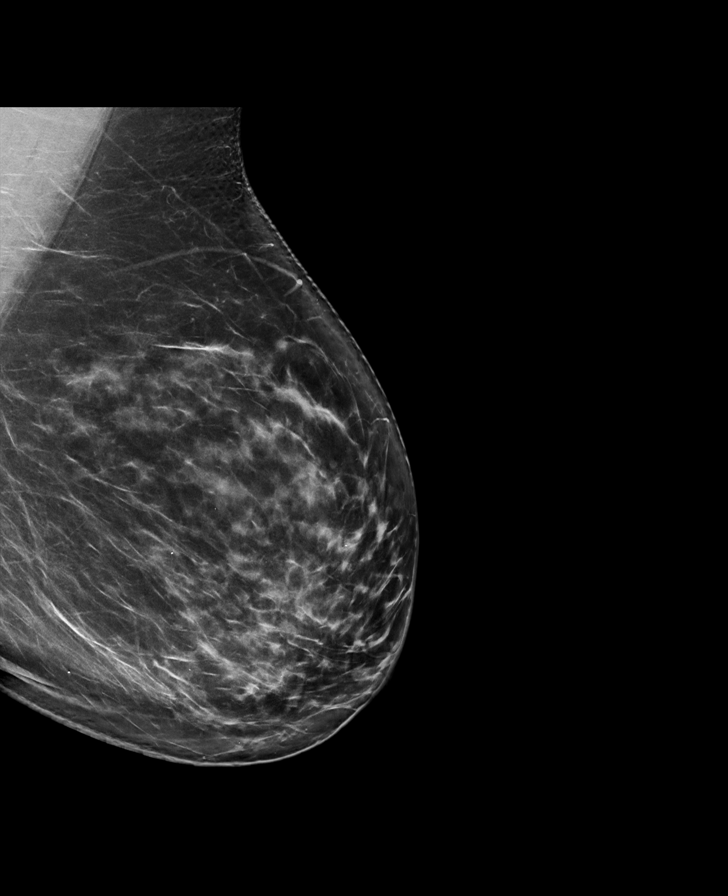

[L CC tomo · tomo slice 39/78.0]
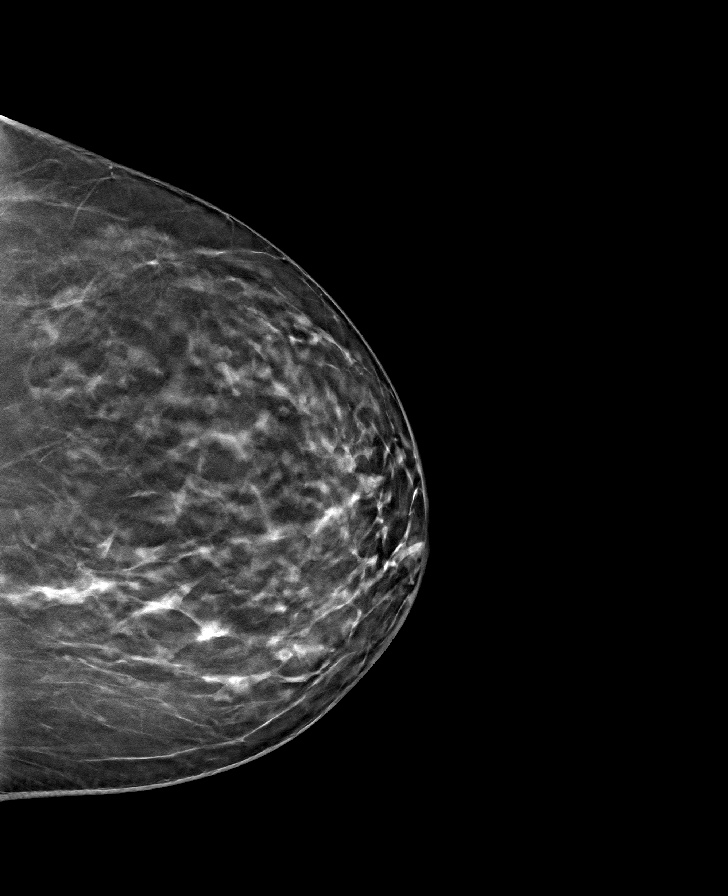

[R MLO tomo · tomo slice 46/91.0]
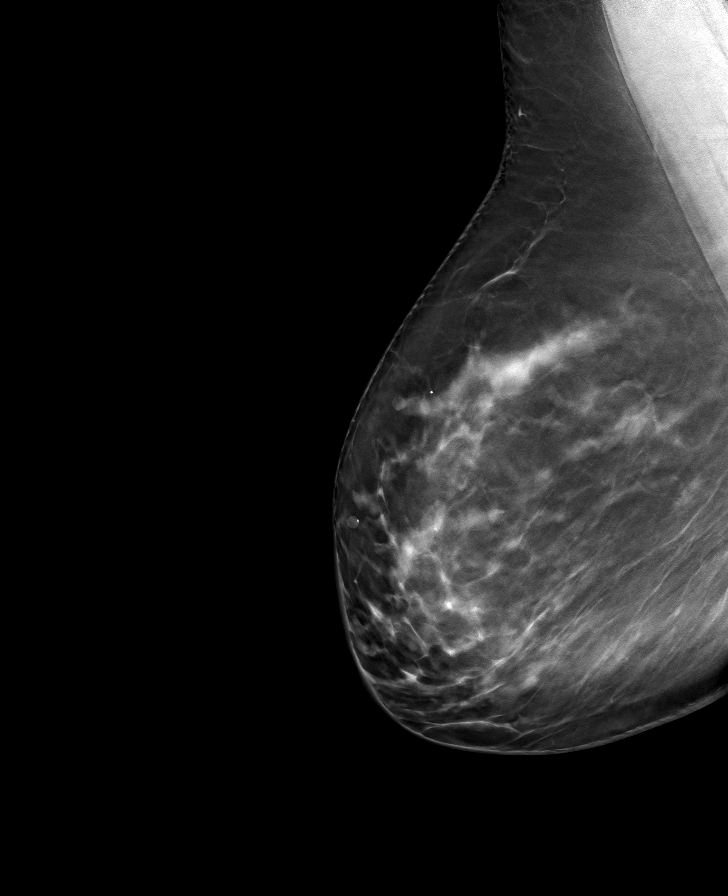

[L MLO tomo · tomo slice 45/90.0]
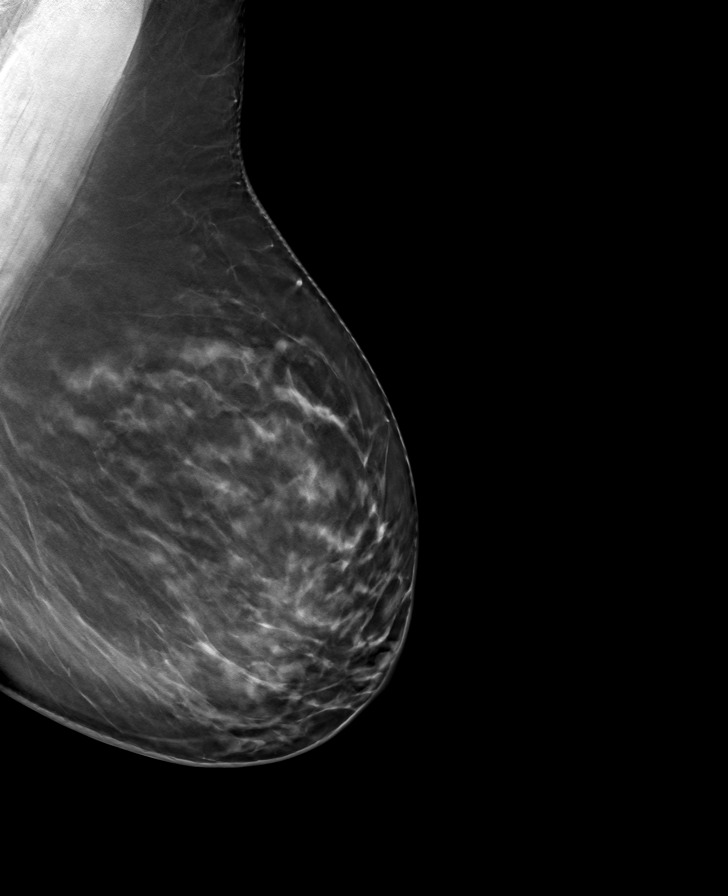

[R CC tomo · tomo slice 39/77.0]
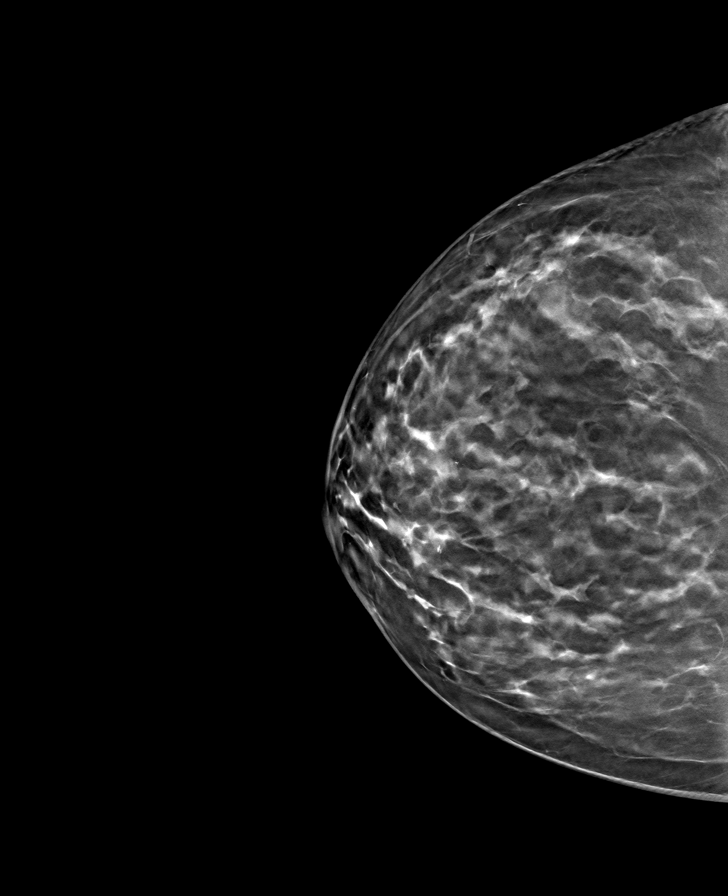

[8 of 24 positions shown; findings below may reference images not displayed]

ACR Breast Density Category c: The breast tissue is heterogeneously
dense, which may obscure small masses.
FINDINGS: There are no findings suspicious for malignancy. Images were
processed with CAD.
IMPRESSION: No mammographic evidence of malignancy. A result letter of this
screening mammogram will be mailed directly to the patient.

RECOMMENDATION:
Screening mammogram in one year. (Code:FT-U-LHB)

BI-RADS CATEGORY  1: Negative.

## 2019-06-26 ENCOUNTER — Ambulatory Visit
Admission: RE | Admit: 2019-06-26 | Discharge: 2019-06-26 | Disposition: A | Discharge status: Home / Self Care | Payer: 59 | Source: Ambulatory Visit | Attending: Geriatric Medicine | Admitting: Geriatric Medicine

## 2019-06-26 ENCOUNTER — Other Ambulatory Visit: Payer: Self-pay

## 2019-06-26 DIAGNOSIS — Z1231 Encounter for screening mammogram for malignant neoplasm of breast: Secondary | ICD-10-CM

## 2019-07-31 NOTE — Progress Notes (Deleted)
59 y.o. G0P0 Married White or Caucasian female here for annual exam.    Patient's last menstrual period was 05/30/2006 (approximate).          Sexually active: {yes no:314532}  The current method of family planning is status post hysterectomy.    Exercising: {yes no:314532}  {types:19826} Smoker:  {YES P5382123  Health Maintenance: Pap:  2008 neg History of abnormal Pap:  Yes, cryo 1990's MMG:  06-26-2019 category c density birads 1:neg Colonoscopy:  07-20-10 Dr Earlean Shawl. neg BMD:   none TDaP:  2015 Pneumonia vaccine(s):  2016 Shingrix:   done Hep C testing: neg 2017 Screening Labs: ***   reports that she has never smoked. She has never used smokeless tobacco. She reports current alcohol use of about 4.0 standard drinks of alcohol per week. She reports that she does not use drugs.  Past Medical History:  Diagnosis Date  . Abnormal Pap smear of cervix    cryo 1990's  . Asthma   . Diabetes ([place]) 2015   prediabetes-on meds  . Hypercholesteremia 2016  . Hypertension 40's  . OSA on CPAP 09/2008    Past Surgical History:  Procedure Laterality Date  . ABDOMINAL HYSTERECTOMY  11/208   fibroids, ovaries remain  . CARPAL TUNNEL RELEASE Right 2000  . CERVICAL FUSION  10/2007   C4-C5-C6, Dr. Ellene Route  . frozen shoulder release Right 05/2016  . GYNECOLOGIC CRYOSURGERY  1990's   cervix  . LAPAROSCOPIC CHOLECYSTECTOMY  1994  . MENISCUS REPAIR Right 2006   Dr. French Ana  . NASAL SINUS SURGERY  2004   with uvula reduction    Current Outpatient Medications  Medication Sig Dispense Refill  . atorvastatin (LIPITOR) 20 MG tablet Take 1 tablet by mouth daily.    Marland Kitchen FIBER PO Take by mouth as needed.     . irbesartan-hydrochlorothiazide (AVALIDE) 150-12.5 MG tablet Take 1 tablet by mouth daily.    Marland Kitchen levocetirizine (XYZAL ALLERGY 24HR) 5 MG tablet     . levothyroxine (SYNTHROID) 50 MCG tablet Take 1 tablet (50 mcg total) by mouth daily. 90 tablet 3  . metFORMIN (GLUCOPHAGE-XR) 500 MG 24 hr  tablet 500 mg daily.    . Multiple Vitamin (MULTIVITAMIN) tablet Take 1 tablet by mouth daily.     No current facility-administered medications for this visit.    Family History  Problem Relation Age of Onset  . Hypertension Mother   . Thyroid disease Mother   . Hyperlipidemia Mother   . Diabetes Father   . Hypertension Father   . Heart attack Father   . Hepatitis C Father   . Heart disease Father        open heart surgery 1982  . Hyperlipidemia Father   . Thyroid disease Maternal Grandmother   . Cancer Maternal Grandfather        related to asbestos and complications with throat/ esophageal cancer  . Breast cancer Neg Hx     Review of Systems  Exam:   LMP 05/30/2006 (Approximate)      General appearance: alert, cooperative and appears stated age Head: Normocephalic, without obvious abnormality, atraumatic Neck: no adenopathy, supple, symmetrical, trachea midline and thyroid {EXAM; THYROID:18604} Lungs: clear to auscultation bilaterally Breasts: {Exam; breast:13139::"normal appearance, no masses or tenderness"} Heart: regular rate and rhythm Abdomen: soft, non-tender; bowel sounds normal; no masses,  no organomegaly Extremities: extremities normal, atraumatic, no cyanosis or edema Skin: Skin color, texture, turgor normal. No rashes or lesions Lymph nodes: Cervical, supraclavicular, and axillary nodes normal.  No abnormal inguinal nodes palpated Neurologic: Grossly normal   Pelvic: External genitalia:  no lesions              Urethra:  normal appearing urethra with no masses, tenderness or lesions              Bartholins and Skenes: normal                 Vagina: normal appearing vagina with normal color and discharge, no lesions              Cervix: {exam; cervix:14595}              Pap taken: {yes no:314532} Bimanual Exam:  Uterus:  {exam; uterus:12215}              Adnexa: {exam; adnexa:12223}               Rectovaginal: Confirms               Anus:  normal  sphincter tone, no lesions  Chaperone, ***Terence Lux, CMA, was present for exam.  A:  Well Woman with normal exam  P:   {plan; gyn:5269::"mammogram","pap smear","return annually or prn"}

## 2019-08-03 ENCOUNTER — Ambulatory Visit: Payer: 59 | Admitting: Obstetrics & Gynecology

## 2019-09-27 NOTE — Progress Notes (Addendum)
59 y.o. G0P0 Married White or Caucasian female here for annual exam.  She did have Covid in January.  Had temp between 102-105.  Felt aweful.  Mother in law died of Covid on Christmas Eve.  Had end of life visit.  This was hard.  Mother in law had multiple medical issues.  Was able to sell her town home.  Denies vaginal bleeding.    PCP:  Dr. Felipa Eth.  Saw him a month ago.  She did bloo  Patient's last menstrual period was 05/30/2006 (approximate).          Sexually active: Yes.    The current method of family planning is status post hysterectomy.    Exercising: No.  exercise Smoker:  no  Health Maintenance: Pap:  2008 neg History of abnormal Pap:  Yes, cryo.  H/o negative cervical pathology with hysterectomy 2008. MMG:  06-26-2019 category c density birads 1:neg Colonoscopy:  07-20-10 Dr Earlean Shawl, neg BMD:   none TDaP:  2015 Pneumonia vaccine(s):  2016 Shingrix:   Done with Dr. Felipa Eth Hep C testing: neg 2017 Screening Labs: 06/2019 with Dr. Felipa Eth.  hbA1c was 6.9.  Calcium was 10.7. repeat was normal at 9.8.     reports that she has never smoked. She has never used smokeless tobacco. She reports current alcohol use of about 1.0 - 2.0 standard drink of alcohol per week. She reports that she does not use drugs.  Past Medical History:  Diagnosis Date  . Abnormal Pap smear of cervix    cryo 1990's  . Asthma   . Diabetes ([place]) 2015   prediabetes-on meds  . Hypercholesteremia 2016  . Hypertension 57's  . OSA on CPAP 09/2008    Past Surgical History:  Procedure Laterality Date  . ABDOMINAL HYSTERECTOMY  11/208   fibroids, ovaries remain  . CARPAL TUNNEL RELEASE Right 2000  . CERVICAL FUSION  10/2007   C4-C5-C6, Dr. Ellene Route  . frozen shoulder release Right 05/2016  . GYNECOLOGIC CRYOSURGERY  1990's   cervix  . LAPAROSCOPIC CHOLECYSTECTOMY  1994  . MENISCUS REPAIR Right 2006   Dr. French Ana  . NASAL SINUS SURGERY  2004   with uvula reduction    Current Outpatient  Medications  Medication Sig Dispense Refill  . amLODipine (NORVASC) 5 MG tablet Take 5 mg by mouth daily.    Marland Kitchen atorvastatin (LIPITOR) 20 MG tablet Take 1 tablet by mouth daily.    Marland Kitchen FIBER PO Take by mouth as needed.     . irbesartan-hydrochlorothiazide (AVALIDE) 150-12.5 MG tablet Take 1 tablet by mouth daily.    Marland Kitchen levocetirizine (XYZAL ALLERGY 24HR) 5 MG tablet     . levothyroxine (SYNTHROID) 50 MCG tablet Take 1 tablet (50 mcg total) by mouth daily. 90 tablet 3  . metFORMIN (GLUCOPHAGE-XR) 500 MG 24 hr tablet 500 mg daily.    . Multiple Vitamin (MULTIVITAMIN) tablet Take 1 tablet by mouth daily.     No current facility-administered medications for this visit.    Family History  Problem Relation Age of Onset  . Hypertension Mother   . Thyroid disease Mother   . Hyperlipidemia Mother   . Diabetes Father   . Hypertension Father   . Heart attack Father   . Hepatitis C Father   . Heart disease Father        open heart surgery 1982  . Hyperlipidemia Father   . Thyroid disease Maternal Grandmother   . Cancer Maternal Grandfather  related to asbestos and complications with throat/ esophageal cancer  . Breast cancer Neg Hx     Review of Systems  Constitutional: Negative.   HENT: Negative.   Eyes: Negative.   Respiratory: Negative.   Cardiovascular: Negative.   Gastrointestinal: Negative.   Endocrine: Negative.   Genitourinary: Negative.   Musculoskeletal: Negative.   Skin: Negative.   Allergic/Immunologic: Negative.   Neurological: Negative.   Hematological: Negative.   Psychiatric/Behavioral: Negative.     Exam:   BP 114/72   Pulse 72   Resp 16   Ht 5' 5.75" (1.67 m)   Wt 244 lb (110.7 kg)   LMP 05/30/2006 (Approximate)   BMI 39.68 kg/m   Height: 5' 5.75" (167 cm)  General appearance: alert, cooperative and appears stated age Head: Normocephalic, without obvious abnormality, atraumatic Neck: no adenopathy, supple, symmetrical, trachea midline and thyroid  normal to inspection and palpation Lungs: clear to auscultation bilaterally Breasts: normal appearance, no masses or tenderness Heart: regular rate and rhythm Abdomen: soft, non-tender; bowel sounds normal; no masses,  no organomegaly Extremities: extremities normal, atraumatic, no cyanosis or edema Skin: Skin color, texture, turgor normal. No rashes or lesions Lymph nodes: Cervical, supraclavicular, and axillary nodes normal. No abnormal inguinal nodes palpated Neurologic: Grossly normal   Pelvic: External genitalia:  no lesions              Urethra:  normal appearing urethra with no masses, tenderness or lesions              Bartholins and Skenes: normal                 Vagina: normal appearing vagina with normal color and discharge, no lesions              Cervix: absent              Pap taken: No. Bimanual Exam:  Uterus:  uterus absent              Adnexa: no mass, fullness, tenderness               Rectovaginal: Confirms               Anus:  normal sphincter tone, no lesions  Chaperone, Olene Floss, CMA, was present for exam.  A:  Well Woman with normal exam PMP, no HRT H/o TAH 11/08 Hypertension Asthma Hyperlipidemia Hypothyroidism OSA  P:   Mammogram guidelines reviewed pap smear not indicated Lab work just done in June with Dr. Felipa Eth Colonoscopy due next year Plan BMD around age 59 TSh obtained today.  Will await results before doing RF. Return annually or prn

## 2019-10-02 ENCOUNTER — Other Ambulatory Visit: Payer: Self-pay

## 2019-10-02 ENCOUNTER — Ambulatory Visit: Payer: 59 | Admitting: Obstetrics & Gynecology

## 2019-10-02 ENCOUNTER — Encounter: Payer: Self-pay | Admitting: Obstetrics & Gynecology

## 2019-10-02 VITALS — BP 114/72 | HR 72 | Resp 16 | Ht 65.75 in | Wt 244.0 lb

## 2019-10-02 DIAGNOSIS — Z01419 Encounter for gynecological examination (general) (routine) without abnormal findings: Secondary | ICD-10-CM

## 2019-10-02 DIAGNOSIS — E039 Hypothyroidism, unspecified: Secondary | ICD-10-CM

## 2019-10-02 NOTE — Addendum Note (Signed)
Addended by: Megan Salon on: 10/02/2019 10:07 AM   Modules accepted: Orders

## 2019-10-03 LAB — TSH: TSH: 2.44 u[IU]/mL (ref 0.450–4.500)

## 2019-10-08 ENCOUNTER — Encounter: Payer: Self-pay | Admitting: Obstetrics & Gynecology

## 2019-10-08 ENCOUNTER — Other Ambulatory Visit: Payer: Self-pay | Admitting: Obstetrics & Gynecology

## 2019-10-08 MED ORDER — LEVOTHYROXINE SODIUM 50 MCG PO TABS: 50.0000 ug | ORAL_TABLET | Freq: Every day | ORAL | 3 refills | Status: DC

## 2019-10-12 ENCOUNTER — Encounter: Payer: Self-pay | Admitting: Obstetrics & Gynecology

## 2020-05-12 ENCOUNTER — Other Ambulatory Visit: Payer: Self-pay | Admitting: Geriatric Medicine

## 2020-05-12 DIAGNOSIS — Z1231 Encounter for screening mammogram for malignant neoplasm of breast: Secondary | ICD-10-CM

## 2020-06-30 ENCOUNTER — Ambulatory Visit
Admission: RE | Admit: 2020-06-30 | Discharge: 2020-06-30 | Disposition: A | Discharge status: Home / Self Care | Payer: 59 | Source: Ambulatory Visit | Attending: Geriatric Medicine | Admitting: Geriatric Medicine

## 2020-06-30 ENCOUNTER — Other Ambulatory Visit: Payer: Self-pay

## 2020-06-30 DIAGNOSIS — Z1231 Encounter for screening mammogram for malignant neoplasm of breast: Secondary | ICD-10-CM

## 2020-07-11 ENCOUNTER — Ambulatory Visit: Payer: 59

## 2020-09-20 ENCOUNTER — Other Ambulatory Visit: Payer: Self-pay | Admitting: Obstetrics & Gynecology

## 2020-10-02 ENCOUNTER — Ambulatory Visit (HOSPITAL_BASED_OUTPATIENT_CLINIC_OR_DEPARTMENT_OTHER): Payer: 59 | Admitting: Obstetrics & Gynecology

## 2020-12-03 ENCOUNTER — Ambulatory Visit (HOSPITAL_BASED_OUTPATIENT_CLINIC_OR_DEPARTMENT_OTHER): Payer: 59 | Admitting: Obstetrics & Gynecology

## 2020-12-03 ENCOUNTER — Encounter (HOSPITAL_BASED_OUTPATIENT_CLINIC_OR_DEPARTMENT_OTHER): Payer: Self-pay

## 2021-01-27 NOTE — Progress Notes (Signed)
61 y.o. G0P0 Married White or Caucasian female here for annual exam.  Had a trip to the [place] in early December.  She had influenza right after the trip.  Is better.    Denies vaginal bleeding.  Patient's last menstrual period was 05/30/2006 (approximate).          Sexually active: Yes.    The current method of family planning is status post hysterectomy.    Smoker:  no  Health Maintenance: Pap:  2008 History of abnormal Pap:  h/o cryo to cervix.  Negative cervical pathology with hysterectomy in 2008 MMG:  07/10/2020 Negative Colonoscopy:  12/09/2020, Dr. Earlean Shawl.  Adenomatous polyp.  Follow up 5 years. BMD:   plan around age 42 Screening Labs: does with PCP   reports that she has never smoked. She has never used smokeless tobacco. She reports current alcohol use of about 1.0 - 2.0 standard drink per week. She reports that she does not use drugs.  Past Medical History:  Diagnosis Date   Abnormal Pap smear of cervix    cryo 1990's   Asthma    Diabetes ([place]) 2015   prediabetes-on meds   Hypercholesteremia 2016   Hypertension 40's   OSA on CPAP 09/2008    Past Surgical History:  Procedure Laterality Date   ABDOMINAL HYSTERECTOMY  11/208   fibroids, ovaries remain   CARPAL TUNNEL RELEASE Right 2000   CERVICAL FUSION  10/2007   C4-C5-C6, Dr. Ellene Route   frozen shoulder release Right 05/2016   GYNECOLOGIC CRYOSURGERY  1990's   cervix   LAPAROSCOPIC CHOLECYSTECTOMY  1994   MENISCUS REPAIR Right 2006   Dr. French Ana   NASAL SINUS SURGERY  2004   with uvula reduction    Current Outpatient Medications  Medication Sig Dispense Refill   amLODipine (NORVASC) 5 MG tablet Take 5 mg by mouth daily.     atorvastatin (LIPITOR) 20 MG tablet Take 1 tablet by mouth daily.     irbesartan-hydrochlorothiazide (AVALIDE) 150-12.5 MG tablet Take 1 tablet by mouth daily.     levocetirizine (XYZAL) 5 MG tablet      levothyroxine (SYNTHROID) 50 MCG tablet Take 1 tablet (50 mcg total) by  mouth daily. 90 tablet 3   metFORMIN (GLUCOPHAGE-XR) 500 MG 24 hr tablet 500 mg daily.     Multiple Vitamin (MULTIVITAMIN) tablet Take 1 tablet by mouth daily.     FIBER PO Take by mouth as needed.  (Patient not taking: Reported on 01/28/2021)     No current facility-administered medications for this visit.    Family History  Problem Relation Age of Onset   Hypertension Mother    Thyroid disease Mother    Hyperlipidemia Mother    Diabetes Father    Hypertension Father    Heart attack Father    Hepatitis C Father    Heart disease Father        open heart surgery 1982   Hyperlipidemia Father    Thyroid disease Maternal Grandmother    Cancer Maternal Grandfather        related to asbestos and complications with throat/ esophageal cancer   Breast cancer Neg Hx     Review of Systems  All other systems reviewed and are negative.  Exam:   BP (!) 147/74 (BP Location: Left Arm, Patient Position: Sitting, Cuff Size: Large)    Pulse 80    Ht 5\' 6"  (1.676 m) Comment: reported   Wt 246 lb 9.6 oz (111.9 kg)  LMP 05/30/2006 (Approximate)    BMI 39.80 kg/m   Height: 5\' 6"  (167.6 cm) (reported)  General appearance: alert, cooperative and appears stated age Head: Normocephalic, without obvious abnormality, atraumatic Neck: no adenopathy, supple, symmetrical, trachea midline and thyroid normal to inspection and palpation Lungs: clear to auscultation bilaterally Breasts: normal appearance, no masses or tenderness Heart: regular rate and rhythm Abdomen: soft, non-tender; bowel sounds normal; no masses,  no organomegaly Extremities: extremities normal, atraumatic, no cyanosis or edema Skin: Skin color, texture, turgor normal. No rashes or lesions Lymph nodes: Cervical, supraclavicular, and axillary nodes normal. No abnormal inguinal nodes palpated Neurologic: Grossly normal   Pelvic: External genitalia:  no lesions              Urethra:  normal appearing urethra with no masses, tenderness  or lesions              Bartholins and Skenes: normal                 Vagina: normal appearing vagina with normal color and no discharge, no lesions              Cervix: absent              Pap taken: No. Bimanual Exam:  Uterus:  uterus absent              Adnexa: no mass, fullness, tenderness               Rectovaginal: Confirms               Anus:  normal sphincter tone, no lesions  Chaperone, Octaviano Batty, CMA, was present for exam.  Assessment/Plan: 1. Well woman exam with routine gynecological exam - pap smears not indicated - MMG normal 06/2020 - colonoscopy 2022 with Dr. Earlean Shawl - lab work done with Dr. Felipa Eth - vaccines updated  2. Postmenopausal - no HRT  3. H/O abdominal hysterectomy - 2008, ovaries remain

## 2021-01-28 ENCOUNTER — Other Ambulatory Visit: Payer: Self-pay

## 2021-01-28 ENCOUNTER — Encounter (HOSPITAL_BASED_OUTPATIENT_CLINIC_OR_DEPARTMENT_OTHER): Payer: Self-pay | Admitting: Obstetrics & Gynecology

## 2021-01-28 ENCOUNTER — Ambulatory Visit (INDEPENDENT_AMBULATORY_CARE_PROVIDER_SITE_OTHER): Payer: 59 | Admitting: Obstetrics & Gynecology

## 2021-01-28 VITALS — BP 147/74 | HR 80 | Ht 66.0 in | Wt 246.6 lb

## 2021-01-28 DIAGNOSIS — Z01419 Encounter for gynecological examination (general) (routine) without abnormal findings: Secondary | ICD-10-CM

## 2021-01-28 DIAGNOSIS — Z90710 Acquired absence of both cervix and uterus: Secondary | ICD-10-CM

## 2021-01-28 DIAGNOSIS — Z780 Asymptomatic menopausal state: Secondary | ICD-10-CM | POA: Diagnosis not present

## 2021-04-28 ENCOUNTER — Other Ambulatory Visit: Payer: Self-pay | Admitting: Obstetrics & Gynecology

## 2021-05-18 DIAGNOSIS — M25541 Pain in joints of right hand: Secondary | ICD-10-CM | POA: Insufficient documentation

## 2021-05-18 DIAGNOSIS — M65331 Trigger finger, right middle finger: Secondary | ICD-10-CM | POA: Insufficient documentation

## 2021-07-21 ENCOUNTER — Other Ambulatory Visit: Payer: Self-pay | Admitting: Internal Medicine

## 2021-07-21 DIAGNOSIS — Z1231 Encounter for screening mammogram for malignant neoplasm of breast: Secondary | ICD-10-CM

## 2021-07-27 ENCOUNTER — Ambulatory Visit
Admission: RE | Admit: 2021-07-27 | Discharge: 2021-07-27 | Disposition: A | Discharge status: Home / Self Care | Payer: 59 | Source: Ambulatory Visit | Attending: Internal Medicine | Admitting: Internal Medicine

## 2021-07-27 DIAGNOSIS — Z1231 Encounter for screening mammogram for malignant neoplasm of breast: Secondary | ICD-10-CM

## 2021-10-28 LAB — HM DIABETES EYE EXAM

## 2021-11-17 ENCOUNTER — Encounter (HOSPITAL_BASED_OUTPATIENT_CLINIC_OR_DEPARTMENT_OTHER): Payer: Self-pay | Admitting: Obstetrics & Gynecology

## 2022-02-01 ENCOUNTER — Ambulatory Visit (HOSPITAL_BASED_OUTPATIENT_CLINIC_OR_DEPARTMENT_OTHER): Payer: 59 | Admitting: Obstetrics & Gynecology

## 2022-02-15 ENCOUNTER — Ambulatory Visit (HOSPITAL_BASED_OUTPATIENT_CLINIC_OR_DEPARTMENT_OTHER): Payer: 59 | Admitting: Family Medicine

## 2022-02-26 ENCOUNTER — Encounter (HOSPITAL_BASED_OUTPATIENT_CLINIC_OR_DEPARTMENT_OTHER): Payer: Self-pay

## 2022-03-25 ENCOUNTER — Ambulatory Visit (HOSPITAL_BASED_OUTPATIENT_CLINIC_OR_DEPARTMENT_OTHER): Payer: 59 | Admitting: Obstetrics & Gynecology

## 2022-03-25 ENCOUNTER — Ambulatory Visit (HOSPITAL_BASED_OUTPATIENT_CLINIC_OR_DEPARTMENT_OTHER): Payer: 59 | Admitting: Family Medicine

## 2022-06-03 ENCOUNTER — Encounter: Payer: Self-pay | Admitting: Family

## 2022-06-03 ENCOUNTER — Ambulatory Visit: Payer: 59 | Admitting: Family

## 2022-06-03 VITALS — BP 130/74 | HR 83 | Temp 97.9°F | Ht 66.0 in | Wt 246.8 lb

## 2022-06-03 DIAGNOSIS — M65331 Trigger finger, right middle finger: Secondary | ICD-10-CM | POA: Diagnosis not present

## 2022-06-03 DIAGNOSIS — I10 Essential (primary) hypertension: Secondary | ICD-10-CM

## 2022-06-03 DIAGNOSIS — D500 Iron deficiency anemia secondary to blood loss (chronic): Secondary | ICD-10-CM

## 2022-06-03 DIAGNOSIS — E039 Hypothyroidism, unspecified: Secondary | ICD-10-CM | POA: Insufficient documentation

## 2022-06-03 DIAGNOSIS — E785 Hyperlipidemia, unspecified: Secondary | ICD-10-CM

## 2022-06-03 DIAGNOSIS — E119 Type 2 diabetes mellitus without complications: Secondary | ICD-10-CM

## 2022-06-03 DIAGNOSIS — G4733 Obstructive sleep apnea (adult) (pediatric): Secondary | ICD-10-CM | POA: Diagnosis not present

## 2022-06-03 DIAGNOSIS — R6882 Decreased libido: Secondary | ICD-10-CM

## 2022-06-03 LAB — MICROALBUMIN / CREATININE URINE RATIO
Creatinine,U: 45.8 mg/dL
Microalb Creat Ratio: 1.5 mg/g (ref 0.0–30.0)
Microalb, Ur: <0.7 mg/dL (ref 0.0–1.9)

## 2022-06-03 LAB — CBC
HCT: 37.6 % (ref 36.0–46.0)
Hemoglobin: 12.5 g/dL (ref 12.0–15.0)
MCHC: 33.3 g/dL (ref 30.0–36.0)
MCV: 79.5 fl (ref 78.0–100.0)
Platelets: 181 10*3/uL (ref 150.0–400.0)
RBC: 4.73 Mil/uL (ref 3.87–5.11)
RDW: 14.7 % (ref 11.5–15.5)
WBC: 5.1 10*3/uL (ref 4.0–10.5)

## 2022-06-03 LAB — IBC + FERRITIN
Ferritin: 70.7 ng/mL (ref 10.0–291.0)
Iron: 49 ug/dL (ref 42–145)
Saturation Ratios: 16.4 % — ABNORMAL LOW (ref 20.0–50.0)
TIBC: 298.2 ug/dL (ref 250.0–450.0)
Transferrin: 213 mg/dL (ref 212.0–360.0)

## 2022-06-03 LAB — LIPID PANEL
Cholesterol: 151 mg/dL (ref 0–200)
HDL: 37.9 mg/dL — ABNORMAL LOW (ref >39.00)
NonHDL: 112.68
Total CHOL/HDL Ratio: 4
Triglycerides: 251 mg/dL — ABNORMAL HIGH (ref 0.0–149.0)
VLDL: 50.2 mg/dL — ABNORMAL HIGH (ref 0.0–40.0)

## 2022-06-03 LAB — HEMOGLOBIN A1C: Hgb A1c MFr Bld: 7.6 % — ABNORMAL HIGH (ref 4.6–6.5)

## 2022-06-03 LAB — LDL CHOLESTEROL, DIRECT: Direct LDL: 90 mg/dL

## 2022-06-03 LAB — TSH: TSH: 2.06 u[IU]/mL (ref 0.35–5.50)

## 2022-06-03 MED ORDER — BUPROPION HCL ER (XL) 150 MG PO TB24: 150.0000 mg | ORAL_TABLET | Freq: Every day | ORAL | 0 refills | Status: DC

## 2022-06-03 NOTE — Assessment & Plan Note (Signed)
Trial decrease metformin to 500 mg XR to see if diarrhea subsides if it does not may need to consider alternative agent  Pt prefers pill over injection  Hga1c today pending results

## 2022-06-03 NOTE — Assessment & Plan Note (Signed)
Ordering cbc ibc ferritin pending results 

## 2022-06-03 NOTE — Assessment & Plan Note (Signed)
Trial wellbutrin 150 mg QD XR

## 2022-06-03 NOTE — Assessment & Plan Note (Signed)
Ordered lipid panel, pending results. Work on low cholesterol diet and exercise as tolerated  

## 2022-06-03 NOTE — Assessment & Plan Note (Signed)
Stable Urine microalbumin ordered

## 2022-06-03 NOTE — Progress Notes (Signed)
New Patient Office Visit  Subjective:  Patient ID: [name], female    DOB: 03-24-1960  Age: 62 y.o. MRN: [id number]  CC:  Chief Complaint  Patient presents with   Establish Care    HPI [name] is here to establish care as a new patient.  Oriented to practice routines and expectations.  Prior provider was: [facility] who has since retired  Pt is with acute concerns.  Pt states has no sex drive which he finds concerning, she has decreased sexual libido. Denies h/o seizures and or eating disorder.   chronic concerns:  Hypothyroidism: not taking this separate from other medications or food. She takes at night with her statin.   Dm2: on metformin 500 mg twice daily last A1c 7.7  Utd on eye exam year. No known h/o thyroid cancer in case we consider a gLP1 pending A1c results.   Does note increased bowel urgency and softer stools since starting metformin. She is on XR.     ROS: Negative unless specifically indicated above in HPI.   Current Outpatient Medications:    amLODipine (NORVASC) 10 MG tablet, Take 1 tablet by mouth daily., Disp: , Rfl:    atorvastatin (LIPITOR) 40 MG tablet, , Disp: , Rfl:    buPROPion (WELLBUTRIN XL) 150 MG 24 hr tablet, Take 1 tablet (150 mg total) by mouth daily., Disp: 90 tablet, Rfl: 0   Cromolyn Sodium (NASAL ALLERGY NA), Place into the nose. Asterpro, Disp: , Rfl:    ELDERBERRY PO, Take by mouth., Disp: , Rfl:    FIBER PO, Take by mouth as needed., Disp: , Rfl:    irbesartan-hydrochlorothiazide (AVALIDE) 300-12.5 MG tablet, Take 1 tablet by mouth daily., Disp: , Rfl:    levocetirizine (XYZAL) 5 MG tablet, , Disp: , Rfl:    levothyroxine (SYNTHROID) 50 MCG tablet, TAKE 1 TABLET BY MOUTH  DAILY, Disp: 90 tablet, Rfl: 0   metFORMIN (GLUCOPHAGE-XR) 500 MG 24 hr tablet, 500 mg in the morning and at bedtime., Disp: , Rfl:    Multiple Vitamin (MULTIVITAMIN) tablet, Take 1 tablet by mouth daily., Disp: , Rfl:  Past Medical  History:  Diagnosis Date   Abnormal Pap smear of cervix    cryo 1990's   Asthma    Diabetes ([facility]) 2015   prediabetes-on meds   Hypercholesteremia 2016   Hypertension 40's   OSA on CPAP 09/2008   Past Surgical History:  Procedure Laterality Date   CARPAL TUNNEL RELEASE Right 2000   CERVICAL FUSION  10/2007   C4-C5-C6, Dr. Danielle Dess   frozen shoulder release Right 05/2016   GYNECOLOGIC CRYOSURGERY  1990's   cervix   LAPAROSCOPIC CHOLECYSTECTOMY  1994   MENISCUS REPAIR Right 2006   Dr. Madelon Lips   NASAL SINUS SURGERY  2004   with uvula reduction   TOTAL VAGINAL HYSTERECTOMY     still with ovaries in place    Objective:   Today's Vitals: BP 130/74 (BP Location: Left Arm)   Pulse 83   Temp 97.9 F (36.6 C) (Temporal)   Ht 5\' 6"  (1.676 m)   Wt 246 lb 12.8 oz (111.9 kg)   LMP 05/30/2006 (Approximate)   SpO2 99%   BMI 39.83 kg/m   Physical Exam Constitutional:      General: She is not in acute distress.    Appearance: Normal appearance. She is normal weight. She is not ill-appearing, toxic-appearing or diaphoretic.  HENT:     Head: Normocephalic.  Cardiovascular:  Rate and Rhythm: Normal rate and regular rhythm.  Pulmonary:     Effort: Pulmonary effort is normal.     Breath sounds: Normal breath sounds.  Abdominal:     General: Bowel sounds are normal.     Tenderness: There is no abdominal tenderness. There is no guarding.  Musculoskeletal:        General: Normal range of motion.  Neurological:     General: No focal deficit present.     Mental Status: She is alert and oriented to person, place, and time. Mental status is at baseline.  Psychiatric:        Mood and Affect: Mood normal.        Behavior: Behavior normal.        Thought Content: Thought content normal.        Judgment: Judgment normal.     Assessment & Plan:  Trigger middle finger of right hand  Obstructive sleep apnea  Iron deficiency anemia due to chronic blood loss Assessment &  Plan: Ordering cbc ibc ferritin pending results.   Orders: -     CBC -     IBC + Ferritin  Controlled type 2 diabetes mellitus without complication, without long-term current use of insulin (HCC) Assessment & Plan: Trial decrease metformin to 500 mg XR to see if diarrhea subsides if it does not may need to consider alternative agent  Pt prefers pill over injection  Hga1c today pending results  Orders: -     Hemoglobin A1c  Primary hypertension Assessment & Plan: Stable Urine microalbumin ordered  Orders: -     Microalbumin / creatinine urine ratio  Acquired hypothyroidism -     TSH  Elevated lipids Assessment & Plan: Ordered lipid panel, pending results. Work on low cholesterol diet and exercise as tolerated   Orders: -     Lipid panel  Decreased libido without sexual dysfunction Assessment & Plan: Trial wellbutrin 150 mg QD XR  Orders: -     buPROPion HCl ER (XL); Take 1 tablet (150 mg total) by mouth daily.  Dispense: 90 tablet; Refill: 0    Follow-up: Return in about 2 months (around 08/03/2022) for f/u new medication .   Mort Sawyers, FNP

## 2022-06-03 NOTE — Patient Instructions (Signed)
  Please advise husband to cut his hair, otherwise pt will not be agreeable to starting medication.   Well start on bupropion 150 mg XL once daily to see if this increases sexual desire.   Stop by the lab prior to leaving today. I will notify you of your results once received.    Regards,   Mort Sawyers FNP-C

## 2022-06-07 ENCOUNTER — Other Ambulatory Visit: Payer: Self-pay | Admitting: Family

## 2022-06-07 DIAGNOSIS — E119 Type 2 diabetes mellitus without complications: Secondary | ICD-10-CM

## 2022-06-07 MED ORDER — SITAGLIPTIN PHOSPHATE 50 MG PO TABS: 50.0000 mg | ORAL_TABLET | Freq: Every day | ORAL | 0 refills | Status: DC

## 2022-06-30 ENCOUNTER — Other Ambulatory Visit: Payer: Self-pay | Admitting: Family

## 2022-06-30 DIAGNOSIS — Z1231 Encounter for screening mammogram for malignant neoplasm of breast: Secondary | ICD-10-CM

## 2022-07-13 ENCOUNTER — Other Ambulatory Visit: Payer: Self-pay | Admitting: Family

## 2022-07-13 ENCOUNTER — Other Ambulatory Visit: Payer: Self-pay

## 2022-07-13 ENCOUNTER — Other Ambulatory Visit (HOSPITAL_COMMUNITY): Payer: Self-pay

## 2022-07-13 MED ORDER — ATORVASTATIN CALCIUM 40 MG PO TABS
40.0000 mg | ORAL_TABLET | Freq: Every day | ORAL | 3 refills | Status: DC
  Filled 2022-07-13 – 2022-07-14 (×2): qty 30, 30d supply, fill #0

## 2022-07-14 ENCOUNTER — Other Ambulatory Visit (HOSPITAL_COMMUNITY): Payer: Self-pay

## 2022-07-14 ENCOUNTER — Other Ambulatory Visit: Payer: Self-pay

## 2022-07-19 ENCOUNTER — Other Ambulatory Visit: Payer: Self-pay

## 2022-07-19 DIAGNOSIS — E119 Type 2 diabetes mellitus without complications: Secondary | ICD-10-CM

## 2022-07-19 MED ORDER — SITAGLIPTIN PHOSPHATE 50 MG PO TABS: 50.0000 mg | ORAL_TABLET | Freq: Every day | ORAL | 3 refills | Status: DC

## 2022-07-30 ENCOUNTER — Other Ambulatory Visit: Payer: Self-pay | Admitting: Family

## 2022-07-30 DIAGNOSIS — R6882 Decreased libido: Secondary | ICD-10-CM

## 2022-08-09 ENCOUNTER — Ambulatory Visit
Admission: RE | Admit: 2022-08-09 | Discharge: 2022-08-09 | Disposition: A | Discharge status: Home / Self Care | Payer: 59 | Source: Ambulatory Visit

## 2022-08-09 DIAGNOSIS — Z1231 Encounter for screening mammogram for malignant neoplasm of breast: Secondary | ICD-10-CM

## 2022-08-10 ENCOUNTER — Encounter: Payer: Self-pay | Admitting: Family

## 2022-08-10 ENCOUNTER — Ambulatory Visit: Payer: 59 | Admitting: Family

## 2022-08-10 VITALS — BP 132/88 | HR 86 | Temp 97.3°F | Ht 66.0 in | Wt 247.0 lb

## 2022-08-10 DIAGNOSIS — R6882 Decreased libido: Secondary | ICD-10-CM

## 2022-08-10 DIAGNOSIS — I10 Essential (primary) hypertension: Secondary | ICD-10-CM | POA: Diagnosis not present

## 2022-08-10 DIAGNOSIS — E119 Type 2 diabetes mellitus without complications: Secondary | ICD-10-CM | POA: Diagnosis not present

## 2022-08-10 DIAGNOSIS — Z7984 Long term (current) use of oral hypoglycemic drugs: Secondary | ICD-10-CM

## 2022-08-10 DIAGNOSIS — E785 Hyperlipidemia, unspecified: Secondary | ICD-10-CM

## 2022-08-10 DIAGNOSIS — E039 Hypothyroidism, unspecified: Secondary | ICD-10-CM

## 2022-08-10 MED ORDER — AMLODIPINE BESYLATE 10 MG PO TABS: 10.0000 mg | ORAL_TABLET | Freq: Every day | ORAL | 3 refills | Status: DC

## 2022-08-10 MED ORDER — LEVOTHYROXINE SODIUM 50 MCG PO TABS: 50.0000 ug | ORAL_TABLET | Freq: Every day | ORAL | 3 refills | Status: DC

## 2022-08-10 MED ORDER — METFORMIN HCL ER 500 MG PO TB24: 500.0000 mg | ORAL_TABLET | Freq: Every day | ORAL | 3 refills | Status: DC

## 2022-08-10 MED ORDER — IRBESARTAN-HYDROCHLOROTHIAZIDE 300-12.5 MG PO TABS: 1.0000 | ORAL_TABLET | Freq: Every day | ORAL | 3 refills | Status: DC

## 2022-08-10 MED ORDER — OZEMPIC (0.25 OR 0.5 MG/DOSE) 2 MG/3ML ~~LOC~~ SOPN: 0.2500 mg | PEN_INJECTOR | SUBCUTANEOUS | 1 refills | Status: DC

## 2022-08-10 NOTE — Patient Instructions (Signed)
  Stop [employer],   Mort Sawyers FNP-C

## 2022-08-10 NOTE — Assessment & Plan Note (Signed)
Continue wellbutrin 150 mg XL once daily.  Improving.

## 2022-08-10 NOTE — Progress Notes (Signed)
Established Patient Office Visit  Subjective:      CC:  Chief Complaint  Patient presents with   Medical Management of Chronic Issues    Follow up Wellbutrin    HPI: [name] is a 62 y.o. female presenting on 08/10/2022 for Medical Management of Chronic Issues (Follow up Wellbutrin) . DM2: decreased metformin to 500 mg XR last visit. Able to tolerate with the decrease.  Pt denies any thyroid cancer personal and or family. Also taking januvia 50 mg once daily.   Decreased libido: started last visit on wellbutrin 150 mg XL once daily, does report increased libido.   Lab Results  Component Value Date   HGBA1C 7.6 (H) 06/03/2022   Hyperlipidemia: started on fish oils for increased triglycerides.  Lab Results  Component Value Date   CHOL 151 06/03/2022   HDL 37.90 (L) 06/03/2022   LDLDIRECT 90.0 06/03/2022   TRIG 251.0 (H) 06/03/2022   CHOLHDL 4 06/03/2022   IDA: mild. Pt started on iron tablet last visit.        Social history:  Relevant past medical, surgical, family and social history reviewed and updated as indicated. Interim medical history since our last visit reviewed.  Allergies and medications reviewed and updated.  DATA REVIEWED: CHART IN EPIC     ROS: Negative unless specifically indicated above in HPI.    Current Outpatient Medications:    atorvastatin (LIPITOR) 40 MG tablet, Take 1 tablet (40 mg total) by mouth daily., Disp: 90 tablet, Rfl: 3   buPROPion (WELLBUTRIN XL) 150 MG 24 hr tablet, TAKE 1 TABLET BY MOUTH EVERY DAY, Disp: 30 tablet, Rfl: 2   Cromolyn Sodium (NASAL ALLERGY NA), Place into the nose. Asterpro, Disp: , Rfl:    ELDERBERRY PO, Take by mouth., Disp: , Rfl:    FIBER PO, Take by mouth as needed., Disp: , Rfl:    levocetirizine (XYZAL) 5 MG tablet, , Disp: , Rfl:    Multiple Vitamin (MULTIVITAMIN) tablet, Take 1 tablet by mouth daily., Disp: , Rfl:    Semaglutide,0.25 or 0.5MG /DOS, (OZEMPIC, 0.25 OR 0.5 MG/DOSE,) 2  MG/3ML SOPN, Inject 0.25 mg into the skin once a week., Disp: 9 mL, Rfl: 1   amLODipine (NORVASC) 10 MG tablet, Take 1 tablet (10 mg total) by mouth daily., Disp: 90 tablet, Rfl: 3   irbesartan-hydrochlorothiazide (AVALIDE) 300-12.5 MG tablet, Take 1 tablet by mouth daily., Disp: 90 tablet, Rfl: 3   levothyroxine (SYNTHROID) 50 MCG tablet, Take 1 tablet (50 mcg total) by mouth daily., Disp: 90 tablet, Rfl: 3   metFORMIN (GLUCOPHAGE-XR) 500 MG 24 hr tablet, Take 1 tablet (500 mg total) by mouth daily with breakfast., Disp: 90 tablet, Rfl: 3      Objective:    BP 132/88 (BP Location: Left Arm, Patient Position: Sitting, Cuff Size: Large)   Pulse 86   Temp (!) 97.3 F (36.3 C) (Temporal)   Ht 5\' 6"  (1.676 m)   Wt 247 lb (112 kg)   LMP 05/30/2006 (Approximate)   SpO2 97%   BMI 39.87 kg/m   Wt Readings from Last 3 Encounters:  08/10/22 247 lb (112 kg)  06/03/22 246 lb 12.8 oz (111.9 kg)  01/28/21 246 lb 9.6 oz (111.9 kg)    Physical Exam Constitutional:      General: She is not in acute distress.    Appearance: Normal appearance. She is normal weight. She is not ill-appearing, toxic-appearing or diaphoretic.  HENT:     Head: Normocephalic.  Cardiovascular:     Rate and Rhythm: Normal rate and regular rhythm.  Pulmonary:     Effort: Pulmonary effort is normal.  Musculoskeletal:        General: Normal range of motion.     Right lower leg: No edema.     Left lower leg: No edema.  Neurological:     General: No focal deficit present.     Mental Status: She is alert and oriented to person, place, and time. Mental status is at baseline.  Psychiatric:        Mood and Affect: Mood normal.        Behavior: Behavior normal.        Thought Content: Thought content normal.        Judgment: Judgment normal.           Assessment & Plan:  Controlled type 2 diabetes mellitus without complication, without long-term current use of insulin (HCC) Assessment & Plan: Stop januvia 50 mg  once daily Start ozempic 0.25 mg weekly  Continue metformin 500 mg XR once daily    Orders: -     metFORMIN HCl ER; Take 1 tablet (500 mg total) by mouth daily with breakfast.  Dispense: 90 tablet; Refill: 3 -     Ozempic (0.25 or 0.5 MG/DOSE); Inject 0.25 mg into the skin once a week.  Dispense: 9 mL; Refill: 1  Acquired hypothyroidism Assessment & Plan: Stable.  Continue levothyroxine 50 mcg once daily .refilled.   Orders: -     Levothyroxine Sodium; Take 1 tablet (50 mcg total) by mouth daily.  Dispense: 90 tablet; Refill: 3  Primary hypertension -     amLODIPine Besylate; Take 1 tablet (10 mg total) by mouth daily.  Dispense: 90 tablet; Refill: 3 -     Irbesartan-hydroCHLOROthiazide; Take 1 tablet by mouth daily.  Dispense: 90 tablet; Refill: 3  Decreased libido without sexual dysfunction Assessment & Plan: Continue wellbutrin 150 mg XL once daily.  Improving.       Return for as scheduled for cpe in august .  Mort Sawyers, MSN, APRN, FNP-C [facility]

## 2022-08-10 NOTE — Assessment & Plan Note (Signed)
Stop januvia 50 mg once daily Start ozempic 0.25 mg weekly  Continue metformin 500 mg XR once daily

## 2022-08-10 NOTE — Assessment & Plan Note (Signed)
Stable.  Continue levothyroxine 50 mcg once daily .refilled.

## 2022-08-11 ENCOUNTER — Other Ambulatory Visit (HOSPITAL_COMMUNITY): Payer: Self-pay

## 2022-08-11 MED ORDER — ATORVASTATIN CALCIUM 40 MG PO TABS: 40.0000 mg | ORAL_TABLET | Freq: Every day | ORAL | 3 refills | Status: DC

## 2022-08-11 MED ORDER — BUPROPION HCL ER (XL) 150 MG PO TB24: 150.0000 mg | ORAL_TABLET | Freq: Every day | ORAL | 3 refills | Status: DC

## 2022-08-11 NOTE — Telephone Encounter (Signed)
Can we look into p/a for ozempic?

## 2022-08-12 ENCOUNTER — Encounter: Payer: Self-pay | Admitting: Family

## 2022-08-12 DIAGNOSIS — E119 Type 2 diabetes mellitus without complications: Secondary | ICD-10-CM

## 2022-08-25 ENCOUNTER — Encounter (INDEPENDENT_AMBULATORY_CARE_PROVIDER_SITE_OTHER): Payer: Self-pay

## 2022-09-21 ENCOUNTER — Encounter: Payer: 59 | Admitting: Family

## 2022-09-22 ENCOUNTER — Encounter: Payer: Self-pay | Admitting: Family

## 2022-09-22 ENCOUNTER — Ambulatory Visit (INDEPENDENT_AMBULATORY_CARE_PROVIDER_SITE_OTHER): Payer: 59 | Admitting: Family

## 2022-09-22 VITALS — BP 136/88 | Temp 98.6°F | Ht 65.75 in | Wt 239.0 lb

## 2022-09-22 DIAGNOSIS — D500 Iron deficiency anemia secondary to blood loss (chronic): Secondary | ICD-10-CM

## 2022-09-22 DIAGNOSIS — E119 Type 2 diabetes mellitus without complications: Secondary | ICD-10-CM | POA: Diagnosis not present

## 2022-09-22 DIAGNOSIS — W57XXXA Bitten or stung by nonvenomous insect and other nonvenomous arthropods, initial encounter: Secondary | ICD-10-CM

## 2022-09-22 DIAGNOSIS — S80869A Insect bite (nonvenomous), unspecified lower leg, initial encounter: Secondary | ICD-10-CM

## 2022-09-22 DIAGNOSIS — Z23 Encounter for immunization: Secondary | ICD-10-CM

## 2022-09-22 DIAGNOSIS — Z0000 Encounter for general adult medical examination without abnormal findings: Secondary | ICD-10-CM | POA: Diagnosis not present

## 2022-09-22 DIAGNOSIS — E785 Hyperlipidemia, unspecified: Secondary | ICD-10-CM | POA: Diagnosis not present

## 2022-09-22 DIAGNOSIS — Z114 Encounter for screening for human immunodeficiency virus [HIV]: Secondary | ICD-10-CM

## 2022-09-22 DIAGNOSIS — E781 Pure hyperglyceridemia: Secondary | ICD-10-CM

## 2022-09-22 DIAGNOSIS — Z7985 Long-term (current) use of injectable non-insulin antidiabetic drugs: Secondary | ICD-10-CM

## 2022-09-22 DIAGNOSIS — E039 Hypothyroidism, unspecified: Secondary | ICD-10-CM

## 2022-09-22 DIAGNOSIS — I10 Essential (primary) hypertension: Secondary | ICD-10-CM | POA: Diagnosis not present

## 2022-09-22 LAB — BASIC METABOLIC PANEL
BUN: 14 mg/dL (ref 6–23)
CO2: 33 mEq/L — ABNORMAL HIGH (ref 19–32)
Calcium: 11.1 mg/dL — ABNORMAL HIGH (ref 8.4–10.5)
Chloride: 98 mEq/L (ref 96–112)
Creatinine, Ser: 0.82 mg/dL (ref 0.40–1.20)
GFR: 77.69 mL/min (ref >60.00)
Glucose, Bld: 132 mg/dL — ABNORMAL HIGH (ref 70–99)
Potassium: 3.9 mEq/L (ref 3.5–5.1)
Sodium: 139 mEq/L (ref 135–145)

## 2022-09-22 LAB — LIPID PANEL
Cholesterol: 133 mg/dL (ref 0–200)
HDL: 34.7 mg/dL — ABNORMAL LOW (ref >39.00)
NonHDL: 98.33
Total CHOL/HDL Ratio: 4
Triglycerides: 213 mg/dL — ABNORMAL HIGH (ref 0.0–149.0)
VLDL: 42.6 mg/dL — ABNORMAL HIGH (ref 0.0–40.0)

## 2022-09-22 LAB — HEMOGLOBIN A1C: Hgb A1c MFr Bld: 7.2 % — ABNORMAL HIGH (ref 4.6–6.5)

## 2022-09-22 LAB — LDL CHOLESTEROL, DIRECT: Direct LDL: 88 mg/dL

## 2022-09-22 MED ORDER — TRIAMCINOLONE ACETONIDE 0.1 % EX CREA: 1.0000 | TOPICAL_CREAM | Freq: Two times a day (BID) | CUTANEOUS | 0 refills | Status: DC

## 2022-09-22 MED ORDER — SEMAGLUTIDE (1 MG/DOSE) 4 MG/3ML ~~LOC~~ SOPN: 1.0000 mg | PEN_INJECTOR | SUBCUTANEOUS | 1 refills | Status: DC

## 2022-09-22 NOTE — Progress Notes (Signed)
Subjective:  Patient ID: [name], female    DOB: [birth year]  Age: 62 y.o. MRN: [id number]  Patient Care Team: Mort Sawyers, FNP as PCP - General (Family Medicine) Jerene Bears, MD as Consulting Physician (Gynecology)   CC:  Chief Complaint  Patient presents with   Annual Exam    HPI [name] is a 62 y.o. female who presents today for an annual physical exam. She reports consuming a general and diabetic  diet, working on [employer].  Trying to work on exercise, walking a few times a week  She generally feels well. She reports sleeping well. She does not have additional problems to discuss today.   Vision:Within last year Dental:Receives regular dental care  Last metabolic panel Lab Results  Component Value Date   GLUCOSE 133 (H) 07/25/2014   NA 142 07/25/2014   K 4.3 07/25/2014   CL 102 07/25/2014   CO2 28 07/25/2014   BUN 15 07/25/2014   CREATININE 0.77 07/25/2014   CALCIUM 9.4 07/25/2014   PROT 7.2 07/25/2014   ALBUMIN 4.0 07/25/2014   BILITOT 0.4 07/25/2014   ALKPHOS 51 07/25/2014   AST 25 07/25/2014   ALT 25 07/25/2014     Mammogram: utd   Last pap: n/a Colonoscopy: utd  Bone density scan: will get next year.   Pt is with acute concerns.  Metformin Xr is still causing Gi upset. Is doing well with ozempic, recently started 0.5 mg weekly in the last one week and states she is tolerating well.   Lab Results  Component Value Date   HGBA1C 7.6 (H) 06/03/2022     Advanced Directives Patient does have advanced directives including living will. She does not have a copy in the electronic medical record.   DEPRESSION SCREENING    09/22/2022    8:02 AM 06/03/2022   12:39 PM 01/28/2021    3:21 PM  PHQ 2/9 Scores  PHQ - 2 Score 0 0 0  PHQ- 9 Score 1 5      ROS: Negative unless specifically indicated above in HPI.    Current Outpatient Medications:    amLODipine (NORVASC) 10 MG tablet, Take 1 tablet (10 mg total) by mouth daily.,  Disp: 90 tablet, Rfl: 3   atorvastatin (LIPITOR) 40 MG tablet, Take 1 tablet (40 mg total) by mouth daily., Disp: 90 tablet, Rfl: 3   buPROPion (WELLBUTRIN XL) 150 MG 24 hr tablet, Take 1 tablet (150 mg total) by mouth daily., Disp: 90 tablet, Rfl: 3   Cromolyn Sodium (NASAL ALLERGY NA), Place into the nose. Asterpro, Disp: , Rfl:    ELDERBERRY PO, Take by mouth., Disp: , Rfl:    FIBER PO, Take by mouth as needed., Disp: , Rfl:    irbesartan-hydrochlorothiazide (AVALIDE) 300-12.5 MG tablet, Take 1 tablet by mouth daily., Disp: 90 tablet, Rfl: 3   levocetirizine (XYZAL) 5 MG tablet, , Disp: , Rfl:    levothyroxine (SYNTHROID) 50 MCG tablet, Take 1 tablet (50 mcg total) by mouth daily., Disp: 90 tablet, Rfl: 3   Multiple Vitamin (MULTIVITAMIN) tablet, Take 1 tablet by mouth daily., Disp: , Rfl:    Semaglutide, 1 MG/DOSE, 4 MG/3ML SOPN, Inject 1 mg as directed once a week., Disp: 3 mL, Rfl: 1   triamcinolone cream (KENALOG) 0.1 %, Apply 1 Application topically 2 (two) times daily., Disp: 30 g, Rfl: 0    Objective:    BP 136/88 (BP Location: Left Arm, Patient Position: Sitting, Cuff Size: Large)  Temp 98.6 F (37 C) (Oral)   Ht 5' 5.75" (1.67 m)   Wt 239 lb (108.4 kg)   LMP 05/30/2006 (Approximate)   BMI 38.87 kg/m   BP Readings from Last 3 Encounters:  09/22/22 136/88  08/10/22 132/88  06/03/22 130/74      Physical Exam Constitutional:      General: She is not in acute distress.    Appearance: Normal appearance. She is obese. She is not ill-appearing.  HENT:     Head: Normocephalic.     Right Ear: Tympanic membrane normal.     Left Ear: Tympanic membrane normal.     Nose: Nose normal.     Mouth/Throat:     Mouth: Mucous membranes are moist.  Eyes:     Extraocular Movements: Extraocular movements intact.     Pupils: Pupils are equal, round, and reactive to light.  Cardiovascular:     Rate and Rhythm: Normal rate and regular rhythm.  Pulmonary:     Effort: Pulmonary  effort is normal.     Breath sounds: Normal breath sounds.  Abdominal:     General: Abdomen is flat. Bowel sounds are normal.     Palpations: Abdomen is soft.     Tenderness: There is no guarding or rebound.  Musculoskeletal:        General: Normal range of motion.     Cervical back: Normal range of motion.  Skin:    General: Skin is warm.     Capillary Refill: Capillary refill takes less than 2 seconds.     Comments: Multiple papular lesions on bil lower extremities   Neurological:     General: No focal deficit present.     Mental Status: She is alert.  Psychiatric:        Mood and Affect: Mood normal.        Behavior: Behavior normal.        Thought Content: Thought content normal.        Judgment: Judgment normal.          Assessment & Plan:  Controlled type 2 diabetes mellitus without complication, without long-term current use of insulin (HCC) Assessment & Plan: Ordered hga1c today pending results. Work on diabetic diet and exercise as tolerated. Yearly foot exam, and annual eye exam.   Foot exam in office today.    Orders: -     Semaglutide (1 MG/DOSE); Inject 1 mg as directed once a week.  Dispense: 3 mL; Refill: 1 -     Basic metabolic panel -     Hemoglobin A1c  Screening for HIV (human immunodeficiency virus) -     HIV Antibody (routine testing w rflx)  Primary hypertension Assessment & Plan: Stable   Orders: -     Basic metabolic panel  Elevated lipids Assessment & Plan: Ordered lipid panel, pending results. Work on low cholesterol diet and exercise as tolerated    Hypertriglyceridemia -     Lipid panel  Encounter for general adult medical examination without abnormal findings Assessment & Plan: Patient Counseling(The following topics were reviewed):  Preventative care handout given to pt  Health maintenance and immunizations reviewed. Please refer to Health maintenance section. Pt advised on safe sex, wearing seatbelts in car, and proper  nutrition labwork ordered today for annual Dental health: Discussed importance of regular tooth brushing, flossing, and dental visits.    Insect bite of lower leg, unspecified laterality, initial encounter -     Triamcinolone Acetonide; Apply 1 Application topically  2 (two) times daily.  Dispense: 30 g; Refill: 0  Acquired hypothyroidism Assessment & Plan: Tsh stable. Continue levothyroxine 50 mcg once daily    Iron deficiency anemia due to chronic blood loss Assessment & Plan: Continue iron supplementation.         Follow-up: No follow-ups on file.   Mort Sawyers, FNP

## 2022-09-22 NOTE — Assessment & Plan Note (Signed)
Stable

## 2022-09-22 NOTE — Assessment & Plan Note (Signed)
Ordered hga1c today pending results. Work on diabetic diet and exercise as tolerated. Yearly foot exam, and annual eye exam.   Foot exam in office today.

## 2022-09-22 NOTE — Assessment & Plan Note (Signed)
Continue iron supplementation

## 2022-09-22 NOTE — Assessment & Plan Note (Signed)

## 2022-09-22 NOTE — Assessment & Plan Note (Signed)
Tsh stable. Continue levothyroxine 50 mcg once daily

## 2022-09-22 NOTE — Assessment & Plan Note (Signed)
Ordered lipid panel, pending results. Work on low cholesterol diet and exercise as tolerated  

## 2022-09-23 ENCOUNTER — Ambulatory Visit (INDEPENDENT_AMBULATORY_CARE_PROVIDER_SITE_OTHER): Payer: 59

## 2022-09-23 ENCOUNTER — Other Ambulatory Visit: Payer: Self-pay | Admitting: Family

## 2022-09-23 LAB — HIV ANTIBODY (ROUTINE TESTING W REFLEX): HIV 1&2 Ab, 4th Generation: NONREACTIVE

## 2022-09-23 NOTE — Progress Notes (Signed)
Can we add vitamin D.

## 2022-09-24 LAB — VITAMIN D 25 HYDROXY (VIT D DEFICIENCY, FRACTURES): VITD: 43.35 ng/mL (ref 30.00–100.00)

## 2022-09-26 ENCOUNTER — Encounter: Payer: Self-pay | Admitting: Family

## 2022-09-29 ENCOUNTER — Other Ambulatory Visit (HOSPITAL_COMMUNITY): Payer: Self-pay

## 2022-10-01 ENCOUNTER — Other Ambulatory Visit (HOSPITAL_COMMUNITY): Payer: Self-pay

## 2022-10-04 ENCOUNTER — Other Ambulatory Visit (HOSPITAL_COMMUNITY): Payer: Self-pay

## 2022-10-06 ENCOUNTER — Other Ambulatory Visit (HOSPITAL_COMMUNITY): Payer: Self-pay

## 2022-10-06 ENCOUNTER — Other Ambulatory Visit: Payer: Self-pay | Admitting: Family

## 2022-10-06 DIAGNOSIS — E119 Type 2 diabetes mellitus without complications: Secondary | ICD-10-CM

## 2022-10-28 ENCOUNTER — Encounter: Payer: Self-pay | Admitting: Family

## 2022-11-12 ENCOUNTER — Other Ambulatory Visit: Payer: Self-pay | Admitting: Family

## 2022-11-12 DIAGNOSIS — E119 Type 2 diabetes mellitus without complications: Secondary | ICD-10-CM

## 2023-02-23 ENCOUNTER — Encounter: Payer: Self-pay | Admitting: Family

## 2023-03-24 ENCOUNTER — Ambulatory Visit: Payer: 59 | Admitting: Family

## 2023-03-24 ENCOUNTER — Encounter: Payer: Self-pay | Admitting: Family

## 2023-03-24 DIAGNOSIS — I10 Essential (primary) hypertension: Secondary | ICD-10-CM | POA: Diagnosis not present

## 2023-03-24 DIAGNOSIS — E611 Iron deficiency: Secondary | ICD-10-CM | POA: Diagnosis not present

## 2023-03-24 DIAGNOSIS — E785 Hyperlipidemia, unspecified: Secondary | ICD-10-CM

## 2023-03-24 DIAGNOSIS — D500 Iron deficiency anemia secondary to blood loss (chronic): Secondary | ICD-10-CM

## 2023-03-24 DIAGNOSIS — E781 Pure hyperglyceridemia: Secondary | ICD-10-CM

## 2023-03-24 DIAGNOSIS — E119 Type 2 diabetes mellitus without complications: Secondary | ICD-10-CM

## 2023-03-24 DIAGNOSIS — E039 Hypothyroidism, unspecified: Secondary | ICD-10-CM

## 2023-03-24 DIAGNOSIS — Z7985 Long-term (current) use of injectable non-insulin antidiabetic drugs: Secondary | ICD-10-CM

## 2023-03-24 LAB — BASIC METABOLIC PANEL
BUN: 12 mg/dL (ref 6–23)
CO2: 36 meq/L — ABNORMAL HIGH (ref 19–32)
Calcium: 10.3 mg/dL (ref 8.4–10.5)
Chloride: 98 meq/L (ref 96–112)
Creatinine, Ser: 0.91 mg/dL (ref 0.40–1.20)
GFR: 68.32 mL/min (ref >60.00)
Glucose, Bld: 105 mg/dL — ABNORMAL HIGH (ref 70–99)
Potassium: 3.5 meq/L (ref 3.5–5.1)
Sodium: 141 meq/L (ref 135–145)

## 2023-03-24 LAB — CBC
HCT: 39.6 % (ref 36.0–46.0)
Hemoglobin: 12.9 g/dL (ref 12.0–15.0)
MCHC: 32.7 g/dL (ref 30.0–36.0)
MCV: 81.6 fl (ref 78.0–100.0)
Platelets: 185 10*3/uL (ref 150.0–400.0)
RBC: 4.85 Mil/uL (ref 3.87–5.11)
RDW: 14.2 % (ref 11.5–15.5)
WBC: 3.5 10*3/uL — ABNORMAL LOW (ref 4.0–10.5)

## 2023-03-24 LAB — MICROALBUMIN / CREATININE URINE RATIO
Creatinine,U: 150.2 mg/dL
Microalb Creat Ratio: 4.7 mg/g (ref 0.0–30.0)
Microalb, Ur: <0.7 mg/dL (ref 0.0–1.9)

## 2023-03-24 LAB — HEMOGLOBIN A1C: Hgb A1c MFr Bld: 6.4 % (ref 4.6–6.5)

## 2023-03-24 NOTE — Assessment & Plan Note (Signed)
 Stable

## 2023-03-24 NOTE — Assessment & Plan Note (Signed)
 Ordered lipid panel, pending results. Work on low cholesterol diet and exercise as tolerated Continue atorvastatin 40 mg nightly

## 2023-03-24 NOTE — Assessment & Plan Note (Signed)
 Ordered hga1c today pending results. Work on diabetic diet and exercise as tolerated. Yearly foot exam, and annual eye exam.   Foot exam in office today.

## 2023-03-24 NOTE — Progress Notes (Signed)
 Established Patient Office Visit  Subjective:      CC:  Chief Complaint  Patient presents with   Medical Management of Chronic Issues    HPI: [name] is a 63 y.o. female presenting on 03/24/2023 for Medical Management of Chronic Issues . DM2: on ozempic 1 mg once weekly tolerating well.  Lab Results  Component Value Date   HGBA1C 7.2 (H) 09/22/2022  Next due again in July   HTN: blood pressure in office good range 122/88. On amlodipine 10 mg once daily. Also taking avalide   Hypothyroid: on levothyroxine 50 mcg once daily.        Social history:  Relevant past medical, surgical, family and social history reviewed and updated as indicated. Interim medical history since our last visit reviewed.  Allergies and medications reviewed and updated.  DATA REVIEWED: CHART IN EPIC     ROS: Negative unless specifically indicated above in HPI.    Current Outpatient Medications:    amLODipine (NORVASC) 10 MG tablet, Take 1 tablet (10 mg total) by mouth daily., Disp: 90 tablet, Rfl: 3   atorvastatin (LIPITOR) 40 MG tablet, Take 1 tablet (40 mg total) by mouth daily., Disp: 90 tablet, Rfl: 3   buPROPion (WELLBUTRIN XL) 150 MG 24 hr tablet, Take 1 tablet (150 mg total) by mouth daily., Disp: 90 tablet, Rfl: 3   Cromolyn Sodium (NASAL ALLERGY NA), Place into the nose. Asterpro, Disp: , Rfl:    ELDERBERRY PO, Take by mouth., Disp: , Rfl:    FIBER PO, Take by mouth as needed., Disp: , Rfl:    irbesartan-hydrochlorothiazide (AVALIDE) 300-12.5 MG tablet, Take 1 tablet by mouth daily., Disp: 90 tablet, Rfl: 3   levocetirizine (XYZAL) 5 MG tablet, , Disp: , Rfl:    levothyroxine (SYNTHROID) 50 MCG tablet, Take 1 tablet (50 mcg total) by mouth daily., Disp: 90 tablet, Rfl: 3   Multiple Vitamin (MULTIVITAMIN) tablet, Take 1 tablet by mouth daily., Disp: , Rfl:    OZEMPIC, 1 MG/DOSE, 4 MG/3ML SOPN, INJECT SUBCUTANEOUSLY 1 MG EVERY WEEK, Disp: 6 mL, Rfl: 5      Objective:     BP 122/88 (BP Location: Left Arm, Patient Position: Sitting, Cuff Size: Large)   Pulse 80   Temp 98.4 F (36.9 C) (Temporal)   Ht 5' 5.75" (1.67 m)   Wt 224 lb 9.6 oz (101.9 kg)   LMP 05/30/2006 (Approximate)   SpO2 98%   BMI 36.53 kg/m   Wt Readings from Last 3 Encounters:  03/24/23 224 lb 9.6 oz (101.9 kg)  09/22/22 239 lb (108.4 kg)  08/10/22 247 lb (112 kg)    Physical Exam Constitutional:      General: She is not in acute distress.    Appearance: Normal appearance. She is normal weight. She is not ill-appearing, toxic-appearing or diaphoretic.  HENT:     Head: Normocephalic.  Cardiovascular:     Rate and Rhythm: Normal rate and regular rhythm.  Pulmonary:     Effort: Pulmonary effort is normal.     Breath sounds: Normal breath sounds.  Musculoskeletal:        General: Normal range of motion.     Right lower leg: No edema.     Left lower leg: No edema.  Neurological:     General: No focal deficit present.     Mental Status: She is alert and oriented to person, place, and time. Mental status is at baseline.  Psychiatric:  Mood and Affect: Mood normal.        Behavior: Behavior normal.        Thought Content: Thought content normal.        Judgment: Judgment normal.        No images are attached to the encounter. Title   Diabetic Foot Exam - detailed Is there a history of foot ulcer?: No Is there a foot ulcer now?: No Is there swelling?: No Is there elevated skin temperature?: No Is there abnormal foot shape?: No Is there a claw toe deformity?: No Are the toenails long?: No Are the toenails thick?: No Are the toenails ingrown?: No Is the skin thin, fragile, shiny and hairless?": No Normal Range of Motion?: Yes Is there foot or ankle muscle weakness?: No Do you have pain in calf while walking?: No Are the shoes appropriate in style and fit?: Yes Can the patient see the bottom of their feet?: Yes Pulse Foot Exam completed.: Yes   Right  Posterior Tibialis: Present Left posterior Tibialis: Present   Right Dorsalis Pedis: Present Left Dorsalis Pedis: Present     Sensory Foot Exam Completed.: Yes Semmes-Weinstein Monofilament Test "+" means "has sensation" and "-" means "no sensation"  R Foot Test Control: Pos L Foot Test Control: Pos   R Site 1-Great Toe: Pos L Site 1-Great Toe: Pos   R Site 4: Pos L Site 4: Pos   R site 5: Pos L Site 5: Pos  R Site 6: Pos L Site 6: Pos     Image components are not supported.   Image components are not supported. Image components are not supported.  [place] Comments       Assessment & Plan:  Serum calcium elevated -     PTH, intact and calcium  Primary hypertension Assessment & Plan: Stable    Controlled type 2 diabetes mellitus without complication, without long-term current use of insulin (HCC) Assessment & Plan: Ordered hga1c today pending results. Work on diabetic diet and exercise as tolerated. Yearly foot exam, and annual eye exam.   Foot exam in office today.    Orders: -     Hemoglobin A1c -     Basic metabolic panel -     Microalbumin / creatinine urine ratio  Acquired hypothyroidism Assessment & Plan: Tsh stable. Continue levothyroxine 50 mcg once daily    Iron deficiency anemia due to chronic blood loss -     CBC -     IBC + Ferritin; Future  Hypertriglyceridemia  Low serum iron  Elevated lipids Assessment & Plan: Ordered lipid panel, pending results. Work on low cholesterol diet and exercise as tolerated Continue atorvastatin 40 mg nightly       No follow-ups on file.  Mort Sawyers, MSN, APRN, FNP-C [facility]

## 2023-03-24 NOTE — Assessment & Plan Note (Signed)
 Tsh stable. Continue levothyroxine 50 mcg once daily

## 2023-03-25 LAB — PTH, INTACT AND CALCIUM
Calcium: 10.7 mg/dL — ABNORMAL HIGH (ref 8.6–10.4)
PTH: 42 pg/mL (ref 16–77)

## 2023-03-28 ENCOUNTER — Encounter: Payer: Self-pay | Admitting: Family

## 2023-03-28 ENCOUNTER — Other Ambulatory Visit: Payer: Self-pay | Admitting: Family

## 2023-03-28 DIAGNOSIS — D72819 Decreased white blood cell count, unspecified: Secondary | ICD-10-CM

## 2023-04-22 ENCOUNTER — Other Ambulatory Visit

## 2023-04-22 DIAGNOSIS — D500 Iron deficiency anemia secondary to blood loss (chronic): Secondary | ICD-10-CM

## 2023-04-22 DIAGNOSIS — D72819 Decreased white blood cell count, unspecified: Secondary | ICD-10-CM

## 2023-04-22 LAB — CBC WITH DIFFERENTIAL/PLATELET
Basophils Absolute: 0 10*3/uL (ref 0.0–0.1)
Basophils Relative: 0.9 % (ref 0.0–3.0)
Eosinophils Absolute: 0 10*3/uL (ref 0.0–0.7)
Eosinophils Relative: 0.4 % (ref 0.0–5.0)
HCT: 37.8 % (ref 36.0–46.0)
Hemoglobin: 12.5 g/dL (ref 12.0–15.0)
Lymphocytes Relative: 51.3 % — ABNORMAL HIGH (ref 12.0–46.0)
Lymphs Abs: 1.5 10*3/uL (ref 0.7–4.0)
MCHC: 33.1 g/dL (ref 30.0–36.0)
MCV: 81 fl (ref 78.0–100.0)
Monocytes Absolute: 0.4 10*3/uL (ref 0.1–1.0)
Monocytes Relative: 12.4 % — ABNORMAL HIGH (ref 3.0–12.0)
Neutro Abs: 1 10*3/uL — ABNORMAL LOW (ref 1.4–7.7)
Neutrophils Relative %: 35 % — ABNORMAL LOW (ref 43.0–77.0)
Platelets: 180 10*3/uL (ref 150.0–400.0)
RBC: 4.67 Mil/uL (ref 3.87–5.11)
RDW: 14.2 % (ref 11.5–15.5)
WBC: 2.8 10*3/uL — ABNORMAL LOW (ref 4.0–10.5)

## 2023-04-22 LAB — IBC + FERRITIN
Ferritin: 105 ng/mL (ref 10.0–291.0)
Iron: 58 ug/dL (ref 42–145)
Saturation Ratios: 19.5 % — ABNORMAL LOW (ref 20.0–50.0)
TIBC: 298.2 ug/dL (ref 250.0–450.0)
Transferrin: 213 mg/dL (ref 212.0–360.0)

## 2023-04-25 ENCOUNTER — Other Ambulatory Visit: Payer: Self-pay | Admitting: Family

## 2023-04-25 ENCOUNTER — Encounter: Payer: Self-pay | Admitting: Family

## 2023-04-25 DIAGNOSIS — D708 Other neutropenia: Secondary | ICD-10-CM | POA: Insufficient documentation

## 2023-05-02 ENCOUNTER — Inpatient Hospital Stay

## 2023-05-02 ENCOUNTER — Inpatient Hospital Stay: Attending: Oncology | Admitting: Oncology

## 2023-05-02 ENCOUNTER — Encounter: Payer: Self-pay | Admitting: Oncology

## 2023-05-02 VITALS — BP 150/79 | HR 80 | Temp 97.7°F | Resp 18 | Ht 65.75 in | Wt 222.5 lb

## 2023-05-02 DIAGNOSIS — I10 Essential (primary) hypertension: Secondary | ICD-10-CM | POA: Diagnosis not present

## 2023-05-02 DIAGNOSIS — D72819 Decreased white blood cell count, unspecified: Secondary | ICD-10-CM | POA: Diagnosis not present

## 2023-05-02 DIAGNOSIS — D708 Other neutropenia: Secondary | ICD-10-CM

## 2023-05-02 DIAGNOSIS — E119 Type 2 diabetes mellitus without complications: Secondary | ICD-10-CM | POA: Insufficient documentation

## 2023-05-02 LAB — FERRITIN: Ferritin: 110 ng/mL (ref 11–307)

## 2023-05-02 LAB — CBC (CANCER CENTER ONLY)
HCT: 38.4 % (ref 36.0–46.0)
Hemoglobin: 12.5 g/dL (ref 12.0–15.0)
MCH: 26.5 pg (ref 26.0–34.0)
MCHC: 32.6 g/dL (ref 30.0–36.0)
MCV: 81.4 fL (ref 80.0–100.0)
Platelet Count: 181 10*3/uL (ref 150–400)
RBC: 4.72 MIL/uL (ref 3.87–5.11)
RDW: 13.8 % (ref 11.5–15.5)
WBC Count: 3.4 10*3/uL — ABNORMAL LOW (ref 4.0–10.5)
nRBC: 0 % (ref 0.0–0.2)

## 2023-05-02 LAB — IRON AND TIBC
Iron: 63 ug/dL (ref 28–170)
Saturation Ratios: 18 % (ref 10.4–31.8)
TIBC: 349 ug/dL (ref 250–450)
UIBC: 286 ug/dL

## 2023-05-02 LAB — VITAMIN B12: Vitamin B-12: 475 pg/mL (ref 180–914)

## 2023-05-02 LAB — FOLATE: Folate: 23 ng/mL (ref >5.9)

## 2023-05-02 NOTE — Progress Notes (Signed)
 [facility]  Telephone:(336) [phone] Fax:(336) [phone]  ID: [name] OB: 1960-07-12  MR#: [id number]  AOZ#:[id number]  Patient Care Team: Mort Sawyers, FNP as PCP - General (Family Medicine) Jerene Bears, MD as Consulting Physician (Gynecology) Jeralyn Ruths, MD as Consulting Physician (Oncology)  CHIEF COMPLAINT: Leukopenia.  INTERVAL HISTORY: Patient is a 63 year old female who was noted to have a mildly reduced total white blood cell count on routine blood work.  She currently feels well and is asymptomatic.  She denies any repeated fevers or illnesses.  She has a good appetite and denies weight loss.  She has no new medications.  She denies any chest pain, shortness of breath, cough, or hemoptysis.  She denies any nausea, vomiting, constipation, or diarrhea.  She has no urinary complaints.  Patient offers no specific complaints today.  REVIEW OF SYSTEMS:   Review of Systems  Constitutional: Negative.  Negative for fever, malaise/fatigue and weight loss.  Respiratory: Negative.  Negative for cough, hemoptysis and shortness of breath.   Cardiovascular: Negative.  Negative for chest pain and leg swelling.  Gastrointestinal: Negative.  Negative for abdominal pain.  Genitourinary: Negative.  Negative for dysuria.  Musculoskeletal: Negative.  Negative for back pain.  Skin: Negative.  Negative for rash.  Neurological: Negative.  Negative for dizziness, focal weakness, weakness and headaches.  Psychiatric/Behavioral: Negative.  The patient is not nervous/anxious.     As per HPI. Otherwise, a complete review of systems is negative.  PAST MEDICAL HISTORY: Past Medical History:  Diagnosis Date   Abnormal Pap smear of cervix    cryo 1990's   Allergy Life-long   Arthritis    Asthma    Diabetes (HCC) 01/25/2013   prediabetes-on meds   GERD (gastroesophageal reflux disease)    Hypercholesteremia 01/25/2014   Hypertension 40's   OSA on CPAP  09/25/2008   Thyroid disease     PAST SURGICAL HISTORY: Past Surgical History:  Procedure Laterality Date   ABDOMINAL HYSTERECTOMY  11/2006   CARPAL TUNNEL RELEASE Right 2000   CERVICAL FUSION  10/2007   C4-C5-C6, Dr. Danielle Dess   frozen shoulder release Right 05/2016   GYNECOLOGIC CRYOSURGERY  1990's   cervix   LAPAROSCOPIC CHOLECYSTECTOMY  1994   MENISCUS REPAIR Right 2006   Dr. Madelon Lips   NASAL SINUS SURGERY  2004   with uvula reduction   SPINE SURGERY     C 4, 5 and 6 fused   TOTAL VAGINAL HYSTERECTOMY     still with ovaries in place    FAMILY HISTORY: Family History  Problem Relation Age of Onset   Hypertension Mother    Thyroid disease Mother    Hyperlipidemia Mother    Diabetes Father    Hypertension Father    Heart attack Father    Hepatitis C Father    Heart disease Father        open heart surgery 1982   Hyperlipidemia Father    Early death Father    Early death Sister    Hypertension Brother    Thyroid disease Maternal Grandmother    Esophageal cancer Maternal Grandfather        asbestos   Cancer Maternal Grandfather    Breast cancer Neg Hx     ADVANCED DIRECTIVES (Y/N):  N  HEALTH MAINTENANCE: Social History   Tobacco Use   Smoking status: Never   Smokeless tobacco: Never  Vaping Use   Vaping status: Never Used  Substance Use Topics   Alcohol  use: Yes    Alcohol/week: 1.0 - 2.0 standard drink of alcohol    Types: 1 - 2 Standard drinks or equivalent per week    Comment: Every seldom consume alcohol.   Drug use: No     Colonoscopy:  PAP:  Bone density:  Lipid panel:  Allergies  Allergen Reactions   Asa [Aspirin] Hives   Metformin And Related Other (See Comments)   Sulfa Antibiotics Hives   Salicylates Rash    Current Outpatient Medications  Medication Sig Dispense Refill   amLODipine (NORVASC) 10 MG tablet Take 1 tablet (10 mg total) by mouth daily. 90 tablet 3   atorvastatin (LIPITOR) 40 MG tablet Take 1 tablet (40 mg total) by  mouth daily. 90 tablet 3   buPROPion (WELLBUTRIN XL) 150 MG 24 hr tablet Take 1 tablet (150 mg total) by mouth daily. 90 tablet 3   Cromolyn Sodium (NASAL ALLERGY NA) Place into the nose. Asterpro     ELDERBERRY PO Take by mouth.     FIBER PO Take by mouth as needed.     irbesartan-hydrochlorothiazide (AVALIDE) 300-12.5 MG tablet Take 1 tablet by mouth daily. 90 tablet 3   levocetirizine (XYZAL) 5 MG tablet      levothyroxine (SYNTHROID) 50 MCG tablet Take 1 tablet (50 mcg total) by mouth daily. 90 tablet 3   Multiple Vitamin (MULTIVITAMIN) tablet Take 1 tablet by mouth daily.     OZEMPIC, 1 MG/DOSE, 4 MG/3ML SOPN INJECT SUBCUTANEOUSLY 1 MG EVERY WEEK 6 mL 5   No current facility-administered medications for this visit.    OBJECTIVE: Vitals:   05/02/23 1501  BP: (!) 150/79  Pulse: 80  Resp: 18  Temp: 97.7 F (36.5 C)  SpO2: 100%     Body mass index is 36.19 kg/m.    ECOG FS:0 - Asymptomatic  General: Well-developed, well-nourished, no acute distress. Eyes: Pink conjunctiva, anicteric sclera. HEENT: Normocephalic, moist mucous membranes. Lungs: No audible wheezing or coughing. Heart: Regular rate and rhythm. Abdomen: Soft, nontender, no obvious distention. Musculoskeletal: No edema, cyanosis, or clubbing. Neuro: Alert, answering all questions appropriately. Cranial nerves grossly intact. Skin: No rashes or petechiae noted. Psych: Normal affect. Lymphatics: No cervical, calvicular, axillary or inguinal LAD.   LAB RESULTS:  Lab Results  Component Value Date   NA 141 03/24/2023   K 3.5 03/24/2023   CL 98 03/24/2023   CO2 36 (H) 03/24/2023   GLUCOSE 105 (H) 03/24/2023   BUN 12 03/24/2023   CREATININE 0.91 03/24/2023   CALCIUM 10.7 (H) 03/24/2023   CALCIUM 10.3 03/24/2023   PROT 7.2 07/25/2014   ALBUMIN 4.0 07/25/2014   AST 25 07/25/2014   ALT 25 07/25/2014   ALKPHOS 51 07/25/2014   BILITOT 0.4 07/25/2014   GFRNONAA >60 11/08/2007   GFRAA  11/08/2007    >60         The eGFR has been calculated using the MDRD equation. This calculation has not been validated in all clinical    Lab Results  Component Value Date   WBC 3.4 (L) 05/02/2023   NEUTROABS 1.0 (L) 04/22/2023   HGB 12.5 05/02/2023   HCT 38.4 05/02/2023   MCV 81.4 05/02/2023   PLT 181 05/02/2023     STUDIES: No results found.  ASSESSMENT: Leukopenia.  PLAN:    Leukopenia: Mild.  Patient's total white blood cell count is 3.4 today.  All of her other laboratory work including flow cytometry, neutrophil antibodies, and IntelliGEN myeloid panel are pending at time  of dictation.  No intervention is needed at this time.  Patient does not require bone marrow biopsy.  Return to clinic in 3 weeks with video-assisted telemedicine visit and discussion of her laboratory results.   I spent a total of 45 minutes reviewing chart data, face-to-face evaluation with the patient, counseling and coordination of care as detailed above.   Patient expressed understanding and was in agreement with this plan. She also understands that She can call clinic at any time with any questions, concerns, or complaints.    Jeralyn Ruths, MD   05/02/2023 4:01 PM

## 2023-05-03 LAB — PROTEIN ELECTROPHORESIS, SERUM
A/G Ratio: 1.1 (ref 0.7–1.7)
Albumin ELP: 3.8 g/dL (ref 2.9–4.4)
Alpha-1-Globulin: 0.2 g/dL (ref 0.0–0.4)
Alpha-2-Globulin: 0.8 g/dL (ref 0.4–1.0)
Beta Globulin: 1.1 g/dL (ref 0.7–1.3)
Gamma Globulin: 1.5 g/dL (ref 0.4–1.8)
Globulin, Total: 3.6 g/dL (ref 2.2–3.9)
Total Protein ELP: 7.4 g/dL (ref 6.0–8.5)

## 2023-05-04 LAB — COMP PANEL: LEUKEMIA/LYMPHOMA

## 2023-05-16 LAB — INTELLIGEN MYELOID

## 2023-05-16 LAB — NEUTROPHIL AB TEST LEVEL 1: NEUTROPHIL SCR/PANEL RESULT: POSITIVE — AB

## 2023-05-23 ENCOUNTER — Inpatient Hospital Stay (HOSPITAL_BASED_OUTPATIENT_CLINIC_OR_DEPARTMENT_OTHER): Admitting: Oncology

## 2023-05-23 DIAGNOSIS — D708 Other neutropenia: Secondary | ICD-10-CM | POA: Diagnosis not present

## 2023-05-23 NOTE — Progress Notes (Unsigned)
 [facility]  Telephone:(336) [phone] Fax:(336) [phone]  ID: [name] OB: January 16, 1961  MR#: [id number]  AOZ#:[id number]  Patient Care Team: Felicita Horns, FNP as PCP - General (Family Medicine) Lillian Rein, MD as Consulting Physician (Gynecology) Shellie Dials, MD as Consulting Physician (Oncology)  I connected with [name] on 05/24/23 at  2:45 PM EDT by video enabled telemedicine visit and verified that I am speaking with the correct person using two identifiers.   I discussed the limitations, risks, security and privacy concerns of performing an evaluation and management service by telemedicine and the availability of in-person appointments. I also discussed with the patient that there may be a patient responsible charge related to this service. The patient expressed understanding and agreed to proceed.   Other persons participating in the visit and their role in the encounter: Patient, MD.  Patient's location: Home. Provider's location: Clinic.  CHIEF COMPLAINT: Leukopenia with positive neutrophil antibodies.  INTERVAL HISTORY: Patient agreed to video-assisted telemedicine visit for further evaluation and discussion of her laboratory results.  She continues to feel well and remains asymptomatic.  She has no neurologic complaints.  She denies any recent fevers or illnesses.  She has a good appetite and denies weight loss.  She denies any chest pain, shortness of breath, cough, or hemoptysis.  She denies any nausea, vomiting, constipation, or diarrhea.  She has no urinary complaints.  Patient offers no specific complaints today.  REVIEW OF SYSTEMS:   Review of Systems  Constitutional: Negative.  Negative for fever, malaise/fatigue and weight loss.  Respiratory: Negative.  Negative for cough, hemoptysis and shortness of breath.   Cardiovascular: Negative.  Negative for chest pain and leg swelling.  Gastrointestinal: Negative.  Negative for  abdominal pain.  Genitourinary: Negative.  Negative for dysuria.  Musculoskeletal: Negative.  Negative for back pain.  Skin: Negative.  Negative for rash.  Neurological: Negative.  Negative for dizziness, focal weakness, weakness and headaches.  Psychiatric/Behavioral: Negative.  The patient is not nervous/anxious.     As per HPI. Otherwise, a complete review of systems is negative.  PAST MEDICAL HISTORY: Past Medical History:  Diagnosis Date   Abnormal Pap smear of cervix    cryo 1990's   Allergy Life-long   Arthritis    Asthma    Diabetes (HCC) 01/25/2013   prediabetes-on meds   GERD (gastroesophageal reflux disease)    Hypercholesteremia 01/25/2014   Hypertension 40's   OSA on CPAP 09/25/2008   Thyroid  disease     PAST SURGICAL HISTORY: Past Surgical History:  Procedure Laterality Date   ABDOMINAL HYSTERECTOMY  11/2006   CARPAL TUNNEL RELEASE Right 2000   CERVICAL FUSION  10/2007   C4-C5-C6, Dr. Ellery Guthrie   frozen shoulder release Right 05/2016   GYNECOLOGIC CRYOSURGERY  1990's   cervix   LAPAROSCOPIC CHOLECYSTECTOMY  1994   MENISCUS REPAIR Right 2006   Dr. Marland Silvas   NASAL SINUS SURGERY  2004   with uvula reduction   SPINE SURGERY     C 4, 5 and 6 fused   TOTAL VAGINAL HYSTERECTOMY     still with ovaries in place    FAMILY HISTORY: Family History  Problem Relation Age of Onset   Hypertension Mother    Thyroid  disease Mother    Hyperlipidemia Mother    Diabetes Father    Hypertension Father    Heart attack Father    Hepatitis C Father    Heart disease Father  open heart surgery 1982   Hyperlipidemia Father    Early death Father    Early death Sister    Hypertension Brother    Thyroid  disease Maternal Grandmother    Esophageal cancer Maternal Grandfather        asbestos   Cancer Maternal Grandfather    Breast cancer Neg Hx     ADVANCED DIRECTIVES (Y/N):  N  HEALTH MAINTENANCE: Social History   Tobacco Use   Smoking status: Never    Smokeless tobacco: Never  Vaping Use   Vaping status: Never Used  Substance Use Topics   Alcohol use: Yes    Alcohol/week: 1.0 - 2.0 standard drink of alcohol    Types: 1 - 2 Standard drinks or equivalent per week    Comment: Every seldom consume alcohol.   Drug use: No     Colonoscopy:  PAP:  Bone density:  Lipid panel:  Allergies  Allergen Reactions   Asa [Aspirin] Hives   Metformin  And Related Other (See Comments)   Sulfa Antibiotics Hives   Salicylates Rash    Current Outpatient Medications  Medication Sig Dispense Refill   amLODipine  (NORVASC ) 10 MG tablet Take 1 tablet (10 mg total) by mouth daily. 90 tablet 3   atorvastatin  (LIPITOR) 40 MG tablet Take 1 tablet (40 mg total) by mouth daily. 90 tablet 3   buPROPion  (WELLBUTRIN  XL) 150 MG 24 hr tablet Take 1 tablet (150 mg total) by mouth daily. 90 tablet 3   Cromolyn Sodium (NASAL ALLERGY NA) Place into the nose. Asterpro     ELDERBERRY PO Take by mouth.     FIBER PO Take by mouth as needed.     irbesartan -hydrochlorothiazide  (AVALIDE) 300-12.5 MG tablet Take 1 tablet by mouth daily. 90 tablet 3   levocetirizine (XYZAL) 5 MG tablet      levothyroxine  (SYNTHROID ) 50 MCG tablet Take 1 tablet (50 mcg total) by mouth daily. 90 tablet 3   Multiple Vitamin (MULTIVITAMIN) tablet Take 1 tablet by mouth daily.     OZEMPIC , 1 MG/DOSE, 4 MG/3ML SOPN INJECT SUBCUTANEOUSLY 1 MG EVERY WEEK 6 mL 5   No current facility-administered medications for this visit.    OBJECTIVE: There were no vitals filed for this visit.    There is no height or weight on file to calculate BMI.    ECOG FS:0 - Asymptomatic  General: Well-developed, well-nourished, no acute distress. HEENT: Normocephalic. Neuro: Alert, answering all questions appropriately. Cranial nerves grossly intact. Psych: Normal affect.  LAB RESULTS:  Lab Results  Component Value Date   NA 141 03/24/2023   K 3.5 03/24/2023   CL 98 03/24/2023   CO2 36 (H) 03/24/2023    GLUCOSE 105 (H) 03/24/2023   BUN 12 03/24/2023   CREATININE 0.91 03/24/2023   CALCIUM  10.7 (H) 03/24/2023   CALCIUM  10.3 03/24/2023   PROT 7.2 07/25/2014   ALBUMIN 4.0 07/25/2014   AST 25 07/25/2014   ALT 25 07/25/2014   ALKPHOS 51 07/25/2014   BILITOT 0.4 07/25/2014   GFRNONAA >60 11/08/2007   GFRAA  11/08/2007    >60        The eGFR has been calculated using the MDRD equation. This calculation has not been validated in all clinical    Lab Results  Component Value Date   WBC 3.4 (L) 05/02/2023   NEUTROABS 1.0 (L) 04/22/2023   HGB 12.5 05/02/2023   HCT 38.4 05/02/2023   MCV 81.4 05/02/2023   PLT 181 05/02/2023  STUDIES: No results found.  ASSESSMENT: Leukopenia with positive neutrophil antibodies.  PLAN:    Leukopenia with positive neutrophil antibodies: Unclear significance of positive neutrophil antibodies as these can be transient in nature.  Patient is also asymptomatic. All of her other laboratory work including flow cytometry and IntelliGEN myeloid panel is either negative or within normal limits.  No intervention is needed at this time.  Patient does not require bone marrow biopsy.  Return to clinic in 3 months with repeat laboratory work and video-assisted telemedicine visit.  I provided 20 minutes of face-to-face video visit time during this encounter which included chart review, counseling, and coordination of care as documented above.    Patient expressed understanding and was in agreement with this plan. She also understands that She can call clinic at any time with any questions, concerns, or complaints.    Shellie Dials, MD   05/23/2023 2:50 PM

## 2023-06-06 ENCOUNTER — Other Ambulatory Visit: Payer: Self-pay | Admitting: Family

## 2023-06-06 DIAGNOSIS — E785 Hyperlipidemia, unspecified: Secondary | ICD-10-CM

## 2023-06-22 ENCOUNTER — Other Ambulatory Visit: Payer: Self-pay | Admitting: Family

## 2023-06-22 DIAGNOSIS — E039 Hypothyroidism, unspecified: Secondary | ICD-10-CM

## 2023-07-01 ENCOUNTER — Other Ambulatory Visit: Payer: Self-pay | Admitting: Family

## 2023-07-01 DIAGNOSIS — R6882 Decreased libido: Secondary | ICD-10-CM

## 2023-08-07 ENCOUNTER — Other Ambulatory Visit: Payer: Self-pay | Admitting: Family

## 2023-08-07 DIAGNOSIS — I10 Essential (primary) hypertension: Secondary | ICD-10-CM

## 2023-08-08 ENCOUNTER — Encounter: Payer: Self-pay | Admitting: Family

## 2023-08-08 DIAGNOSIS — U071 COVID-19: Secondary | ICD-10-CM

## 2023-08-09 DIAGNOSIS — U071 COVID-19: Secondary | ICD-10-CM | POA: Insufficient documentation

## 2023-08-09 MED ORDER — NIRMATRELVIR/RITONAVIR (PAXLOVID)TABLET
3.0000 | ORAL_TABLET | Freq: Two times a day (BID) | ORAL | 0 refills | Status: AC
Start: 2023-08-09 — End: 2023-08-14

## 2023-08-09 NOTE — Telephone Encounter (Signed)
   Please see the MyChart message reply(ies) for my assessment and plan.  The patient gave consent for this Medical Advice Message and is aware that it may result in a bill to their insurance company as well as the possibility that this may result in a co-payment or deductible. They are an established patient, but are not seeking medical advice exclusively about a problem treated during an in person or video visit in the last 7 days. I did not recommend an in person or video visit within 7 days of my reply.  I spent a total of 3 minutes cumulative time within 7 days through MyChart messaging Azalyn Sliwa, FNP  

## 2023-08-09 NOTE — Addendum Note (Signed)
 Addended by: CORWIN ANTU on: 08/09/2023 12:41 PM   Modules accepted: Orders

## 2023-08-15 ENCOUNTER — Other Ambulatory Visit

## 2023-08-17 ENCOUNTER — Telehealth: Admitting: Oncology

## 2023-08-22 ENCOUNTER — Inpatient Hospital Stay: Attending: Oncology

## 2023-08-22 ENCOUNTER — Other Ambulatory Visit

## 2023-08-22 DIAGNOSIS — D708 Other neutropenia: Secondary | ICD-10-CM | POA: Diagnosis present

## 2023-08-22 LAB — CBC WITH DIFFERENTIAL/PLATELET
Abs Immature Granulocytes: 0.01 K/uL (ref 0.00–0.07)
Basophils Absolute: 0 K/uL (ref 0.0–0.1)
Basophils Relative: 1 %
Eosinophils Absolute: 0 K/uL (ref 0.0–0.5)
Eosinophils Relative: 1 %
HCT: 36.4 % (ref 36.0–46.0)
Hemoglobin: 12.1 g/dL (ref 12.0–15.0)
Immature Granulocytes: 0 %
Lymphocytes Relative: 40 %
Lymphs Abs: 1.5 K/uL (ref 0.7–4.0)
MCH: 27.3 pg (ref 26.0–34.0)
MCHC: 33.2 g/dL (ref 30.0–36.0)
MCV: 82 fL (ref 80.0–100.0)
Monocytes Absolute: 0.4 K/uL (ref 0.1–1.0)
Monocytes Relative: 11 %
Neutro Abs: 1.7 K/uL (ref 1.7–7.7)
Neutrophils Relative %: 47 %
Platelets: 194 K/uL (ref 150–400)
RBC: 4.44 MIL/uL (ref 3.87–5.11)
RDW: 13.3 % (ref 11.5–15.5)
WBC: 3.7 K/uL — ABNORMAL LOW (ref 4.0–10.5)
nRBC: 0 % (ref 0.0–0.2)

## 2023-08-24 ENCOUNTER — Telehealth: Admitting: Oncology

## 2023-08-26 ENCOUNTER — Other Ambulatory Visit (HOSPITAL_BASED_OUTPATIENT_CLINIC_OR_DEPARTMENT_OTHER): Payer: Self-pay | Admitting: Family

## 2023-08-26 DIAGNOSIS — Z1231 Encounter for screening mammogram for malignant neoplasm of breast: Secondary | ICD-10-CM

## 2023-08-30 ENCOUNTER — Encounter (HOSPITAL_BASED_OUTPATIENT_CLINIC_OR_DEPARTMENT_OTHER): Payer: Self-pay | Admitting: Radiology

## 2023-08-30 ENCOUNTER — Ambulatory Visit (HOSPITAL_BASED_OUTPATIENT_CLINIC_OR_DEPARTMENT_OTHER)
Admission: RE | Admit: 2023-08-30 | Discharge: 2023-08-30 | Disposition: A | Discharge status: Home / Self Care | Source: Ambulatory Visit

## 2023-08-30 ENCOUNTER — Telehealth: Admitting: Oncology

## 2023-08-30 DIAGNOSIS — Z1231 Encounter for screening mammogram for malignant neoplasm of breast: Secondary | ICD-10-CM | POA: Diagnosis present

## 2023-08-31 LAB — NEUTROPHIL AB TEST LEVEL 1: NEUTROPHIL SCR/PANEL RESULT: POSITIVE — AB

## 2023-09-01 ENCOUNTER — Ambulatory Visit: Payer: Self-pay | Admitting: Family

## 2023-09-05 ENCOUNTER — Inpatient Hospital Stay: Attending: Oncology | Admitting: Oncology

## 2023-09-05 DIAGNOSIS — D708 Other neutropenia: Secondary | ICD-10-CM

## 2023-09-05 NOTE — Progress Notes (Signed)
 [facility]  Telephone:(336) [phone] Fax:(336) [phone]  ID: [name] OB: 18-Jan-1961  MR#: [id number]  RDW#:[id number]  Patient Care Team: Corwin Antu, FNP as PCP - General (Family Medicine) Cleotilde Ronal RAMAN, MD as Consulting Physician (Gynecology) Jacobo Evalene PARAS, MD as Consulting Physician (Oncology)  I connected with [name] on 09/06/23 at  2:15 PM EDT by video enabled telemedicine visit and verified that I am speaking with the correct person using two identifiers.   I discussed the limitations, risks, security and privacy concerns of performing an evaluation and management service by telemedicine and the availability of in-person appointments. I also discussed with the patient that there may be a patient responsible charge related to this service. The patient expressed understanding and agreed to proceed.   Other persons participating in the visit and their role in the encounter: Patient, MD.  Patient's location: Home. Provider's location: Clinic.  CHIEF COMPLAINT: Leukopenia with positive neutrophil antibodies.  INTERVAL HISTORY: Patient discussion of her laboratory results.  She continues to feel well and remains asymptomatic. She has no neurologic complaints.  She denies any recent fevers or illnesses.  She has a good appetite and denies weight loss.  She denies any chest pain, shortness of breath, cough, or hemoptysis.  She denies any nausea, vomiting, constipation, or diarrhea.  She has no urinary complaints.  Patient offers no specific complaints today.  REVIEW OF SYSTEMS:   Review of Systems  Constitutional: Negative.  Negative for fever, malaise/fatigue and weight loss.  Respiratory: Negative.  Negative for cough, hemoptysis and shortness of breath.   Cardiovascular: Negative.  Negative for chest pain and leg swelling.  Gastrointestinal: Negative.  Negative for abdominal pain.  Genitourinary: Negative.  Negative for dysuria.   Musculoskeletal: Negative.  Negative for back pain.  Skin: Negative.  Negative for rash.  Neurological: Negative.  Negative for dizziness, focal weakness, weakness and headaches.  Psychiatric/Behavioral: Negative.  The patient is not nervous/anxious.     As per HPI. Otherwise, a complete review of systems is negative.  PAST MEDICAL HISTORY: Past Medical History:  Diagnosis Date   Abnormal Pap smear of cervix    cryo 1990's   Allergy Life-long   Arthritis    Asthma    Diabetes (HCC) 01/25/2013   prediabetes-on meds   GERD (gastroesophageal reflux disease)    Hypercholesteremia 01/25/2014   Hypertension 40's   OSA on CPAP 09/25/2008   Thyroid  disease     PAST SURGICAL HISTORY: Past Surgical History:  Procedure Laterality Date   ABDOMINAL HYSTERECTOMY  11/2006   CARPAL TUNNEL RELEASE Right 2000   CERVICAL FUSION  10/2007   C4-C5-C6, Dr. Colon   frozen shoulder release Right 05/2016   GYNECOLOGIC CRYOSURGERY  1990's   cervix   LAPAROSCOPIC CHOLECYSTECTOMY  1994   MENISCUS REPAIR Right 2006   Dr. Shari   NASAL SINUS SURGERY  2004   with uvula reduction   SPINE SURGERY     C 4, 5 and 6 fused   TOTAL VAGINAL HYSTERECTOMY     still with ovaries in place    FAMILY HISTORY: Family History  Problem Relation Age of Onset   Hypertension Mother    Thyroid  disease Mother    Hyperlipidemia Mother    Diabetes Father    Hypertension Father    Heart attack Father    Hepatitis C Father    Heart disease Father        open heart surgery 1982   Hyperlipidemia Father  Early death Father    Early death Sister    Hypertension Brother    Thyroid  disease Maternal Grandmother    Esophageal cancer Maternal Grandfather        asbestos   Cancer Maternal Grandfather    Breast cancer Neg Hx     ADVANCED DIRECTIVES (Y/N):  N  HEALTH MAINTENANCE: Social History   Tobacco Use   Smoking status: Never   Smokeless tobacco: Never  Vaping Use   Vaping status: Never Used   Substance Use Topics   Alcohol use: Yes    Alcohol/week: 1.0 - 2.0 standard drink of alcohol    Types: 1 - 2 Standard drinks or equivalent per week    Comment: Every seldom consume alcohol.   Drug use: No     Colonoscopy:  PAP:  Bone density:  Lipid panel:  Allergies  Allergen Reactions   Asa [Aspirin] Hives   Metformin  And Related Other (See Comments)   Sulfa Antibiotics Hives   Salicylates Rash    Current Outpatient Medications  Medication Sig Dispense Refill   amLODipine  (NORVASC ) 10 MG tablet TAKE 1 TABLET BY MOUTH DAILY 90 tablet 3   atorvastatin  (LIPITOR) 40 MG tablet TAKE 1 TABLET BY MOUTH DAILY 90 tablet 3   buPROPion  (WELLBUTRIN  XL) 150 MG 24 hr tablet TAKE 1 TABLET BY MOUTH DAILY 90 tablet 3   Cromolyn Sodium (NASAL ALLERGY NA) Place into the nose. Asterpro     ELDERBERRY PO Take by mouth.     FIBER PO Take by mouth as needed.     irbesartan -hydrochlorothiazide  (AVALIDE) 300-12.5 MG tablet TAKE 1 TABLET BY MOUTH DAILY 90 tablet 3   levocetirizine (XYZAL) 5 MG tablet      levothyroxine  (SYNTHROID ) 50 MCG tablet TAKE 1 TABLET BY MOUTH DAILY 90 tablet 3   Multiple Vitamin (MULTIVITAMIN) tablet Take 1 tablet by mouth daily.     OZEMPIC , 1 MG/DOSE, 4 MG/3ML SOPN INJECT SUBCUTANEOUSLY 1 MG EVERY WEEK 6 mL 5   No current facility-administered medications for this visit.    OBJECTIVE: There were no vitals filed for this visit.    There is no height or weight on file to calculate BMI.    ECOG FS:0 - Asymptomatic  General: Well-developed, well-nourished, no acute distress. HEENT: Normocephalic. Neuro: Alert, answering all questions appropriately. Cranial nerves grossly intact. Psych: Normal affect. SABRA LAB RESULTS:  Lab Results  Component Value Date   NA 141 03/24/2023   K 3.5 03/24/2023   CL 98 03/24/2023   CO2 36 (H) 03/24/2023   GLUCOSE 105 (H) 03/24/2023   BUN 12 03/24/2023   CREATININE 0.91 03/24/2023   CALCIUM  10.7 (H) 03/24/2023   CALCIUM  10.3  03/24/2023   PROT 7.2 07/25/2014   ALBUMIN 4.0 07/25/2014   AST 25 07/25/2014   ALT 25 07/25/2014   ALKPHOS 51 07/25/2014   BILITOT 0.4 07/25/2014   GFRNONAA >60 11/08/2007   GFRAA  11/08/2007    >60        The eGFR has been calculated using the MDRD equation. This calculation has not been validated in all clinical    Lab Results  Component Value Date   WBC 3.7 (L) 08/22/2023   NEUTROABS 1.7 08/22/2023   HGB 12.1 08/22/2023   HCT 36.4 08/22/2023   MCV 82.0 08/22/2023   PLT 194 08/22/2023     STUDIES: MM 3D SCREENING MAMMOGRAM BILATERAL BREAST Result Date: 09/01/2023 CLINICAL DATA:  Screening. EXAM: DIGITAL SCREENING BILATERAL MAMMOGRAM WITH TOMOSYNTHESIS AND CAD  TECHNIQUE: Bilateral screening digital craniocaudal and mediolateral oblique mammograms were obtained. Bilateral screening digital breast tomosynthesis was performed. The images were evaluated with computer-aided detection. COMPARISON:  Previous exam(s). ACR Breast Density Category c: The breasts are heterogeneously dense, which may obscure small masses. FINDINGS: There are no findings suspicious for malignancy. IMPRESSION: No mammographic evidence of malignancy. A result letter of this screening mammogram will be mailed directly to the patient. RECOMMENDATION: Screening mammogram in one year. (Code:SM-B-01Y) BI-RADS CATEGORY  1: Negative. Electronically Signed   By: Toribio Agreste M.D.   On: 09/01/2023 09:28    ASSESSMENT: Leukopenia with positive neutrophil antibodies.  PLAN:    Leukopenia with positive neutrophil antibodies: Patient's total white blood cell count is only mildly elevated and she remains asymptomatic.  Her neutrophil antibodies are persistently positive on 2 separate lab draws.  Unclear the clinical significance.  Previously, all of her other laboratory work including flow cytometry and IntelliGEN myeloid panel was either negative or within normal limits.  No intervention is needed at this time.  Patient  does not require bone marrow biopsy.  After discussion with the patient, it was agreed upon that no further follow-up is been scheduled.  Please refer patient back if she becomes symptomatic or significantly and persistently neutropenic.  I provided 20 minutes of face-to-face video visit time during this encounter which included chart review, counseling, and coordination of care as documented above.    Patient expressed understanding and was in agreement with this plan. She also understands that She can call clinic at any time with any questions, concerns, or complaints.    Evalene JINNY Reusing, MD   09/05/2023 2:28 PM

## 2023-09-27 ENCOUNTER — Ambulatory Visit: Payer: 59 | Admitting: Family

## 2023-09-27 ENCOUNTER — Encounter: Payer: Self-pay | Admitting: Family

## 2023-09-27 VITALS — BP 138/84 | HR 68 | Temp 98.5°F | Ht 65.75 in | Wt 216.6 lb

## 2023-09-27 DIAGNOSIS — Z23 Encounter for immunization: Secondary | ICD-10-CM

## 2023-09-27 DIAGNOSIS — E119 Type 2 diabetes mellitus without complications: Secondary | ICD-10-CM

## 2023-09-27 DIAGNOSIS — E039 Hypothyroidism, unspecified: Secondary | ICD-10-CM | POA: Diagnosis not present

## 2023-09-27 DIAGNOSIS — Z0000 Encounter for general adult medical examination without abnormal findings: Secondary | ICD-10-CM | POA: Diagnosis not present

## 2023-09-27 DIAGNOSIS — Z7985 Long-term (current) use of injectable non-insulin antidiabetic drugs: Secondary | ICD-10-CM

## 2023-09-27 DIAGNOSIS — Z1231 Encounter for screening mammogram for malignant neoplasm of breast: Secondary | ICD-10-CM

## 2023-09-27 DIAGNOSIS — Z13220 Encounter for screening for lipoid disorders: Secondary | ICD-10-CM | POA: Diagnosis not present

## 2023-09-27 DIAGNOSIS — G4733 Obstructive sleep apnea (adult) (pediatric): Secondary | ICD-10-CM

## 2023-09-27 DIAGNOSIS — Z1211 Encounter for screening for malignant neoplasm of colon: Secondary | ICD-10-CM

## 2023-09-27 LAB — BASIC METABOLIC PANEL WITH GFR
BUN: 12 mg/dL (ref 6–23)
CO2: 32 meq/L (ref 19–32)
Calcium: 10 mg/dL (ref 8.4–10.5)
Chloride: 100 meq/L (ref 96–112)
Creatinine, Ser: 0.85 mg/dL (ref 0.40–1.20)
GFR: 73.88 mL/min (ref >60.00)
Glucose, Bld: 105 mg/dL — ABNORMAL HIGH (ref 70–99)
Potassium: 4.2 meq/L (ref 3.5–5.1)
Sodium: 140 meq/L (ref 135–145)

## 2023-09-27 LAB — TSH: TSH: 4.34 u[IU]/mL (ref 0.35–5.50)

## 2023-09-27 LAB — LIPID PANEL
Cholesterol: 153 mg/dL (ref 0–200)
HDL: 40.7 mg/dL (ref >39.00)
LDL Cholesterol: 75 mg/dL (ref 0–99)
NonHDL: 112.45
Total CHOL/HDL Ratio: 4
Triglycerides: 188 mg/dL — ABNORMAL HIGH (ref 0.0–149.0)
VLDL: 37.6 mg/dL (ref 0.0–40.0)

## 2023-09-27 LAB — MICROALBUMIN / CREATININE URINE RATIO
Creatinine,U: 175.6 mg/dL
Microalb Creat Ratio: 10 mg/g (ref 0.0–30.0)
Microalb, Ur: 1.8 mg/dL (ref 0.0–1.9)

## 2023-09-27 LAB — HEMOGLOBIN A1C: Hgb A1c MFr Bld: 6.3 % (ref 4.6–6.5)

## 2023-09-27 MED ORDER — OZEMPIC (1 MG/DOSE) 4 MG/3ML ~~LOC~~ SOPN: 1.0000 mg | PEN_INJECTOR | SUBCUTANEOUS | 1 refills | Status: DC

## 2023-09-27 NOTE — Progress Notes (Signed)
 Subjective:  Patient ID: Elouise Jenkins Kil, female    DOB: [birth year]  Age: 63 y.o. MRN: [id number]  Patient Care Team: Corwin Antu, FNP as PCP - General (Family Medicine) Cleotilde Ronal RAMAN, MD as Consulting Physician (Gynecology) Jacobo Evalene PARAS, MD as Consulting Physician (Oncology)   CC:  Chief Complaint  Patient presents with   Annual Exam    HPI Tamira Madden Piazza is a 63 y.o. female who presents today for an annual physical exam. She reports consuming a general diet. The patient does not participate in regular exercise at present. She generally feels well. She reports sleeping well. She does not have additional problems to discuss today.   Vision:Within last year Dental:Receives regular dental care  Mammogram: 08/30/23  Last pap: hysterectomy  Colonoscopy: 2012 every ten years  Bone density scan:  Pt is without acute concerns.   Discussed the use of AI scribe software for clinical note transcription with the patient, who gave verbal consent to proceed.  History of Present Illness Doria Caterin Tabares is a 63 year old female who presents for a routine follow-up and medication review.  She is currently taking amlodipine  10 mg for hypertension, with a recent blood pressure reading of 142/82 mmHg. She continues to take atorvastatin  40 mg for cholesterol management and reports no issues with these medications.  She is on Ozempic  1 mg for diabetes management and is tolerating it well without any problematic side effects. She has approximately eight weeks of medication remaining and prefers to receive her medications by mail to avoid copays.  She takes levothyroxine  50 mcg, ensuring it is taken separately from food and other medications.  She has a history of a hysterectomy due to fibroids on the outside of her uterus, which were not cancerous.  She uses a CPAP machine, which was prescribed over ten years ago. She has not had a recent sleep study but maintains her equipment  with a SoClean device. She has enough CPAP supplies for about a year and a half.   Advanced Directives Patient does have advanced directives. She does not have a copy in the electronic medical record.   DEPRESSION SCREENING    05/02/2023    3:29 PM 09/22/2022    8:02 AM 06/03/2022   12:39 PM 01/28/2021    3:21 PM  PHQ 2/9 Scores  PHQ - 2 Score 0 0 0 0  PHQ- 9 Score  1 5      ROS: Negative unless specifically indicated above in HPI.    Current Outpatient Medications:    amLODipine  (NORVASC ) 10 MG tablet, TAKE 1 TABLET BY MOUTH DAILY, Disp: 90 tablet, Rfl: 3   atorvastatin  (LIPITOR) 40 MG tablet, TAKE 1 TABLET BY MOUTH DAILY, Disp: 90 tablet, Rfl: 3   buPROPion  (WELLBUTRIN  XL) 150 MG 24 hr tablet, TAKE 1 TABLET BY MOUTH DAILY, Disp: 90 tablet, Rfl: 3   Cromolyn Sodium (NASAL ALLERGY NA), Place into the nose. Asterpro, Disp: , Rfl:    ELDERBERRY PO, Take by mouth., Disp: , Rfl:    irbesartan -hydrochlorothiazide  (AVALIDE) 300-12.5 MG tablet, TAKE 1 TABLET BY MOUTH DAILY, Disp: 90 tablet, Rfl: 3   levocetirizine (XYZAL) 5 MG tablet, , Disp: , Rfl:    levothyroxine  (SYNTHROID ) 50 MCG tablet, TAKE 1 TABLET BY MOUTH DAILY, Disp: 90 tablet, Rfl: 3   Multiple Vitamin (MULTIVITAMIN) tablet, Take 1 tablet by mouth daily., Disp: , Rfl:    Semaglutide , 1 MG/DOSE, (OZEMPIC , 1 MG/DOSE,) 4 MG/3ML SOPN, Inject 1 mg into  the skin once a week., Disp: 9 mL, Rfl: 1    Objective:    BP (!) 142/82 (BP Location: Left Arm, Patient Position: Sitting, Cuff Size: Large)   Pulse 68   Temp 98.5 F (36.9 C) (Temporal)   Ht 5' 5.75 (1.67 m)   Wt 216 lb 9.6 oz (98.2 kg)   LMP 05/30/2006 (Approximate)   SpO2 98%   BMI 35.23 kg/m   BP Readings from Last 3 Encounters:  09/27/23 (!) 142/82  05/02/23 (!) 150/79  03/24/23 122/88      Physical Exam Vitals reviewed.  Constitutional:      General: She is not in acute distress.    Appearance: Normal appearance. She is obese. She is not ill-appearing.  HENT:      Head: Normocephalic.     Right Ear: Tympanic membrane normal.     Left Ear: Tympanic membrane normal.     Nose: Nose normal.     Mouth/Throat:     Mouth: Mucous membranes are moist.  Eyes:     Extraocular Movements: Extraocular movements intact.     Pupils: Pupils are equal, round, and reactive to light.  Cardiovascular:     Rate and Rhythm: Normal rate and regular rhythm.  Pulmonary:     Effort: Pulmonary effort is normal.     Breath sounds: Normal breath sounds.  Musculoskeletal:        General: Normal range of motion.     Cervical back: Normal range of motion.  Skin:    General: Skin is warm.     Capillary Refill: Capillary refill takes less than 2 seconds.  Neurological:     General: No focal deficit present.     Mental Status: She is alert.  Psychiatric:        Mood and Affect: Mood normal.        Behavior: Behavior normal.        Thought Content: Thought content normal.        Judgment: Judgment normal.       Results       Assessment & Plan:   Assessment and Plan Assessment & Plan Adult Wellness Visit Routine wellness visit with no acute concerns. Post-hysterectomy, no PAP smears needed. - Administer pneumonia and tetanus vaccinations - Obtain laboratory tests Patient Counseling(The following topics were reviewed):  Preventative care handout given to pt  Health maintenance and immunizations reviewed. Please refer to Health maintenance section. Pt advised on safe sex, wearing seatbelts in car, and proper nutrition labwork ordered today for annual Dental health: Discussed importance of regular tooth brushing, flossing, and dental visits.   Primary hypertension Blood pressure at 142/82 mmHg, indicating suboptimal control on Amlodipine  10 mg daily. - Repeat blood pressure measurement  Type 2 diabetes mellitus without complication Managed with Ozempic  1 mg weekly, well-tolerated with no side effects. A1c monitoring planned. Prefers Recruitment consultant  for zero copay. - Await A1c results before adjusting Ozempic  prescription  Acquired hypothyroidism Managed with Levothyroxine  50 mcg daily. Instructed to take separately from food and other medications. - Check thyroid  function tests  Hyperlipidemia and hypertriglyceridemia Managed with Atorvastatin  40 mg daily.  Obstructive sleep apnea Uses CPAP machine, last sleep study over five years ago. Reports snoring if CPAP is not used. Equipment cleaned daily with SoClean. Has sufficient supplies for a year and a half. - Refer to Pulmonary for evaluation and potential new sleep study  Recording duration: 4 minutes        Follow-up: Return in  about 6 months (around 03/26/2024) for f/u diabetes.   Ginger Patrick, FNP

## 2023-09-28 ENCOUNTER — Ambulatory Visit: Payer: Self-pay | Admitting: Family

## 2023-09-28 DIAGNOSIS — E039 Hypothyroidism, unspecified: Secondary | ICD-10-CM

## 2023-10-03 MED ORDER — LEVOTHYROXINE SODIUM 75 MCG PO TABS: 75.0000 ug | ORAL_TABLET | Freq: Every day | ORAL | 3 refills | Status: DC

## 2023-10-04 ENCOUNTER — Encounter: Payer: Self-pay | Admitting: Family

## 2023-10-04 DIAGNOSIS — Z23 Encounter for immunization: Secondary | ICD-10-CM

## 2023-10-04 NOTE — Telephone Encounter (Signed)
 Do you know if this is the correct covid vaccination?  Someone auto populated this for me and I think since they are not officially in the pharmacies can this even be written?

## 2023-10-04 NOTE — Telephone Encounter (Signed)
Ok to send as pended.

## 2023-10-05 MED ORDER — COVID-19 MRNA VAC-TRIS(PFIZER) 30 MCG/0.3ML IM SUSY: 0.3000 mL | PREFILLED_SYRINGE | Freq: Once | INTRAMUSCULAR | 0 refills | Status: AC

## 2023-10-05 NOTE — Telephone Encounter (Signed)
 Thanks so much. Will save!

## 2023-10-06 ENCOUNTER — Other Ambulatory Visit: Payer: Self-pay | Admitting: *Deleted

## 2023-10-06 DIAGNOSIS — E039 Hypothyroidism, unspecified: Secondary | ICD-10-CM

## 2023-10-06 MED ORDER — LEVOTHYROXINE SODIUM 75 MCG PO TABS: 75.0000 ug | ORAL_TABLET | Freq: Every day | ORAL | 3 refills | Status: AC

## 2023-10-14 ENCOUNTER — Encounter: Payer: Self-pay | Admitting: Family

## 2023-10-17 ENCOUNTER — Encounter: Payer: Self-pay | Admitting: Family

## 2023-10-17 ENCOUNTER — Other Ambulatory Visit: Payer: Self-pay | Admitting: Family

## 2023-10-17 DIAGNOSIS — E119 Type 2 diabetes mellitus without complications: Secondary | ICD-10-CM

## 2023-11-15 ENCOUNTER — Other Ambulatory Visit (INDEPENDENT_AMBULATORY_CARE_PROVIDER_SITE_OTHER)

## 2023-11-15 ENCOUNTER — Ambulatory Visit: Payer: Self-pay | Admitting: Family

## 2023-11-15 DIAGNOSIS — E039 Hypothyroidism, unspecified: Secondary | ICD-10-CM

## 2023-11-15 LAB — TSH: TSH: 1.58 u[IU]/mL (ref 0.35–5.50)

## 2023-12-12 ENCOUNTER — Ambulatory Visit (HOSPITAL_BASED_OUTPATIENT_CLINIC_OR_DEPARTMENT_OTHER): Admitting: Pulmonary Disease

## 2023-12-13 ENCOUNTER — Encounter: Payer: Self-pay | Admitting: Family

## 2023-12-28 ENCOUNTER — Encounter: Payer: Self-pay | Admitting: Sleep Medicine

## 2023-12-28 ENCOUNTER — Ambulatory Visit: Admitting: Sleep Medicine

## 2023-12-28 VITALS — BP 120/80 | HR 82 | Temp 98.0°F | Ht 66.0 in | Wt 215.4 lb

## 2023-12-28 DIAGNOSIS — I10 Essential (primary) hypertension: Secondary | ICD-10-CM

## 2023-12-28 DIAGNOSIS — G4733 Obstructive sleep apnea (adult) (pediatric): Secondary | ICD-10-CM

## 2023-12-28 NOTE — Progress Notes (Signed)
 Name:Lashya Berlinda Farve MRN: [id number] DOB: [birth year]   CHIEF COMPLAINT:  ESTABLISH CARE FOR OSA   HISTORY OF PRESENT ILLNESS: Ms. [name] is a 63 y.o. w/ a h/o OSA, DMII, HTN, hypothyroidism, hyperlipidemia and obesity who presents to establish care for OSA. Reports that she was initially diagnosed with OSA several years ago and was subsequently started on CPAP therapy. Reports using CPAP therapy every night, which is confirmed by compliance data. She is currently using the Airfit P10 nasal pillow mask, which is comfortable. Reports nocturnal awakenings due to nocturia, however does not have difficulty falling back to sleep. Reports a 50 lb weight loss over the last year with Ozempic . Admits to night sweats. Denies morning headaches, RLS symptoms, dream enactment, cataplexy, hypnagogic or hypnapompic hallucinations. Reports a family history of sleep apnea. Denies drowsy driving. Drinks a few cups of tea daily, occasional alcohol use, denies tobacco or illicit drug use.   Bedtime 11 pm-2 am Sleep onset 30 mins Rise time 8-11 am   EPWORTH SLEEP SCORE 7    12/28/2023   11:00 AM  Results of the Epworth flowsheet  Sitting and reading 1  Watching TV 1  Sitting, inactive in a public place (e.g. a theatre or a meeting) 1  As a passenger in a car for an hour without a break 2  Lying down to rest in the afternoon when circumstances permit 2  Sitting and talking to someone 0  Sitting quietly after a lunch without alcohol 0  In a car, while stopped for a few minutes in traffic 0  Total score 7    PAST MEDICAL HISTORY :   has a past medical history of Abnormal Pap smear of cervix, Allergy (Life-long), Arthritis, Asthma, Diabetes (HCC) (01/25/2013), GERD (gastroesophageal reflux disease), Hypercholesteremia (01/25/2014), Hypertension (40's), OSA on CPAP (09/25/2008), Sleep apnea, and Thyroid  disease.  has a past surgical history that includes Laparoscopic cholecystectomy (1994);  Gynecologic cryosurgery [id number]); Meniscus repair (Right, 2006); Nasal sinus surgery (2004); Carpal tunnel release (Right, 2000); Cervical fusion (10/2007); frozen shoulder release (Right, 05/2016); Total vaginal hysterectomy; Abdominal hysterectomy (11/2006); and Spine surgery. Prior to Admission medications   Medication Sig Start Date End Date Taking? Authorizing Provider  amLODipine  (NORVASC ) 10 MG tablet TAKE 1 TABLET BY MOUTH DAILY 08/08/23  Yes Dugal, Tabitha, FNP  atorvastatin  (LIPITOR) 40 MG tablet TAKE 1 TABLET BY MOUTH DAILY 06/07/23  Yes Dugal, Tabitha, FNP  buPROPion  (WELLBUTRIN  XL) 150 MG 24 hr tablet TAKE 1 TABLET BY MOUTH DAILY 07/04/23  Yes Dugal, Tabitha, FNP  Cromolyn Sodium (NASAL ALLERGY NA) Place into the nose. Asterpro   Yes [provider]  ELDERBERRY PO Take by mouth.   Yes [provider]  irbesartan -hydrochlorothiazide  (AVALIDE) 300-12.5 MG tablet TAKE 1 TABLET BY MOUTH DAILY 08/08/23  Yes Dugal, Tabitha, FNP  levocetirizine (XYZAL) 5 MG tablet    Yes [provider]  levothyroxine  (SYNTHROID ) 75 MCG tablet Take 1 tablet (75 mcg total) by mouth daily. 10/06/23  Yes Dugal, Tabitha, FNP  Multiple Vitamin (MULTIVITAMIN) tablet Take 1 tablet by mouth daily.   Yes [provider]  OZEMPIC , 1 MG/DOSE, 4 MG/3ML SOPN INJECT SUBCUTANEOUSLY 1 MG EVERY WEEK 10/17/23  Yes Dugal, Tabitha, FNP   Allergies  Allergen Reactions   Asa [Aspirin] Hives   Metformin  And Related Other (See Comments)   Sulfa Antibiotics Hives   Salicylates Rash    FAMILY HISTORY:  family history includes Birth defects in her sister; Cancer in  her maternal grandfather; Diabetes in her father; Early death in her father and sister; Esophageal cancer in her maternal grandfather; Heart attack in her father; Heart disease in her father; Hepatitis C in her father; Hyperlipidemia in her father and mother; Hypertension in her brother, father, and mother; Thyroid  disease in her maternal  grandmother and mother. SOCIAL HISTORY:  reports that she has never smoked. She has never used smokeless tobacco. She reports current alcohol use of about 1.0 - 2.0 standard drink of alcohol per week. She reports that she does not use drugs.   Review of Systems:  Gen:  Denies  fever, sweats, chills weight loss  HEENT: Denies blurred vision, double vision, ear pain, eye pain, hearing loss, nose bleeds, sore throat Cardiac:  No dizziness, chest pain or heaviness, chest tightness,edema, No JVD Resp:   No cough, -sputum production, -shortness of breath,-wheezing, -hemoptysis,  Gi: Denies swallowing difficulty, stomach pain, nausea or vomiting, diarrhea, constipation, bowel incontinence Gu:  Denies bladder incontinence, burning urine Ext:   Denies Joint pain, stiffness or swelling Skin: Denies  skin rash, easy bruising or bleeding or hives Endoc:  Denies polyuria, polydipsia , polyphagia or weight change Psych:   Denies depression, insomnia or hallucinations  Other:  All other systems negative  VITAL SIGNS: BP 120/80   Pulse 82   Temp 98 F (36.7 C)   Ht 5' 6 (1.676 m)   Wt 215 lb 6.4 oz (97.7 kg)   LMP 05/30/2006 (Approximate)   SpO2 99%   BMI 34.77 kg/m    Physical Examination:   General Appearance: No distress  EYES PERRLA, EOM intact.   NECK Supple, No JVD Pulmonary: normal breath sounds, No wheezing.  CardiovascularNormal S1,S2.  No m/r/g.   Abdomen: Benign, Soft, non-tender. Skin:   warm, no rashes, no ecchymosis  Extremities: normal, no cyanosis, clubbing. Neuro:without focal findings,  speech normal  PSYCHIATRIC: Mood, affect within normal limits.   ASSESSMENT AND PLAN  OSA Patient is using and benefiting from CPAP therapy. Discussed the consequences of untreated sleep apnea. Advised not to drive drowsy for safety of patient and others. Will follow up in 1 year.    HTN Stable, on current management. Following with PCP.    Patient  satisfied with Plan of  action and management. All questions answered  I spent a total of 47 minutes reviewing chart data, face-to-face evaluation with the patient, counseling and coordination of care as detailed above.    Toussaint Golson, M.D.  Sleep Medicine [facility]

## 2023-12-28 NOTE — Patient Instructions (Addendum)

## 2024-01-03 ENCOUNTER — Encounter: Payer: Self-pay | Admitting: Sleep Medicine

## 2024-01-03 DIAGNOSIS — G4733 Obstructive sleep apnea (adult) (pediatric): Secondary | ICD-10-CM

## 2024-01-16 NOTE — Telephone Encounter (Signed)
 I have sent an urgent message to [employer] asking about this issue. I think part of it was my fault

## 2024-01-16 NOTE — Telephone Encounter (Signed)
 I received a message from [place] with [employer] RE: Patient has called [employer] and they state they don't have the Renew Cpap supply order Received: Today New, Adine   We recieved the order 01/10/2024 and it was recieved by [facility] resupply team on 01/16/2024 244pm.  I also just reprocessed per your request today 01/16/2024 257pm

## 2024-01-16 NOTE — Telephone Encounter (Signed)
 Order was placed on 12/9 and sent on 12/12.  [place] can you see why [employer] is saying the do not have it? Thank you!

## 2024-01-23 NOTE — Telephone Encounter (Signed)
 I have sent another urgent message to [employer] asking what else is needed for the patient to get her supplies

## 2024-01-24 NOTE — Telephone Encounter (Signed)
 I have received a message from [place] with [employer] New, Adine   This order has been recieved and sent in multipal times. I have mesage the leaders of the fax intake team and the leader of [employer] resupply along with my manager and sales for details.  I will let you know status as soon as I get a reply.

## 2024-01-30 LAB — OPHTHALMOLOGY REPORT-SCANNED

## 2024-02-10 ENCOUNTER — Telehealth: Payer: Self-pay

## 2024-02-10 NOTE — Telephone Encounter (Signed)
 Copied from CRM [id number]. Topic: Clinical - Order For Equipment >> Feb 10, 2024  1:25 PM Rilla B wrote: Reason for CRM: Nathanel from [employer] regarding CPAP supplies.  Order has been waiting since last month.  [employer] needs a signed order from Dr Jess.  Please FAX to: [phone]  As soon as order received, supplies will ship to patient
